# Patient Record
Sex: Male | Born: 1960 | Hispanic: No | Marital: Married | Smoking: Never smoker
Health system: Southern US, Community
[De-identification: ages and names within clinical notes are randomized; demographics above are authoritative.]

## PROBLEM LIST (undated history)

## (undated) DIAGNOSIS — E785 Hyperlipidemia, unspecified: Secondary | ICD-10-CM

## (undated) DIAGNOSIS — G4700 Insomnia, unspecified: Secondary | ICD-10-CM

## (undated) DIAGNOSIS — G4730 Sleep apnea, unspecified: Secondary | ICD-10-CM

## (undated) DIAGNOSIS — F32A Depression, unspecified: Secondary | ICD-10-CM

## (undated) DIAGNOSIS — F329 Major depressive disorder, single episode, unspecified: Secondary | ICD-10-CM

## (undated) DIAGNOSIS — M1990 Unspecified osteoarthritis, unspecified site: Secondary | ICD-10-CM

## (undated) HISTORY — DX: Hyperlipidemia, unspecified: E78.5

## (undated) HISTORY — DX: Insomnia, unspecified: G47.00

## (undated) HISTORY — PX: VASECTOMY: SHX75

## (undated) HISTORY — DX: Sleep apnea, unspecified: G47.30

## (undated) HISTORY — PX: APPENDECTOMY: SHX54

## (undated) HISTORY — DX: Major depressive disorder, single episode, unspecified: F32.9

## (undated) HISTORY — DX: Depression, unspecified: F32.A

## (undated) HISTORY — PX: OTHER SURGICAL HISTORY: SHX169

## (undated) HISTORY — PX: SPINE SURGERY: SHX786

---

## 2005-01-07 ENCOUNTER — Emergency Department: Payer: Self-pay | Admitting: Internal Medicine

## 2006-05-26 ENCOUNTER — Ambulatory Visit: Payer: Self-pay | Admitting: Gastroenterology

## 2008-01-12 ENCOUNTER — Other Ambulatory Visit: Payer: Self-pay

## 2008-01-12 ENCOUNTER — Emergency Department: Payer: Self-pay | Admitting: Internal Medicine

## 2015-04-24 ENCOUNTER — Encounter: Payer: Self-pay | Admitting: Family Medicine

## 2015-06-11 ENCOUNTER — Ambulatory Visit (INDEPENDENT_AMBULATORY_CARE_PROVIDER_SITE_OTHER): Payer: BC Managed Care – PPO | Admitting: Family Medicine

## 2015-06-11 ENCOUNTER — Encounter: Payer: Self-pay | Admitting: Family Medicine

## 2015-06-11 VITALS — BP 127/79 | HR 60 | Temp 97.7°F | Ht 69.6 in | Wt 227.0 lb

## 2015-06-11 DIAGNOSIS — E785 Hyperlipidemia, unspecified: Secondary | ICD-10-CM | POA: Insufficient documentation

## 2015-06-11 DIAGNOSIS — M25561 Pain in right knee: Secondary | ICD-10-CM | POA: Insufficient documentation

## 2015-06-11 DIAGNOSIS — Z0000 Encounter for general adult medical examination without abnormal findings: Secondary | ICD-10-CM

## 2015-06-11 DIAGNOSIS — G4700 Insomnia, unspecified: Secondary | ICD-10-CM | POA: Insufficient documentation

## 2015-06-11 LAB — URINALYSIS, ROUTINE W REFLEX MICROSCOPIC
Bilirubin, UA: NEGATIVE
GLUCOSE, UA: NEGATIVE
Ketones, UA: NEGATIVE
Leukocytes, UA: NEGATIVE
Nitrite, UA: NEGATIVE
PROTEIN UA: NEGATIVE
RBC, UA: NEGATIVE
Specific Gravity, UA: 1.015 (ref 1.005–1.030)
Urobilinogen, Ur: 0.2 mg/dL (ref 0.2–1.0)
pH, UA: 6 (ref 5.0–7.5)

## 2015-06-11 MED ORDER — SILDENAFIL CITRATE 100 MG PO TABS: 50.0000 mg | ORAL_TABLET | Freq: Every day | ORAL | Status: DC | PRN

## 2015-06-11 NOTE — Assessment & Plan Note (Signed)
Discuss knee pain with becoming a chronic condition will refer to orthopedics to further evaluate.

## 2015-06-11 NOTE — Progress Notes (Signed)
BP 127/79 mmHg  Pulse 60  Temp(Src) 97.7 F (36.5 C)  Ht 5' 9.6" (1.768 m)  Wt 227 lb (102.967 kg)  BMI 32.94 kg/m2  SpO2 99%   Subjective:    Patient ID: Kristopher Baird, male    DOB: 1961/03/12, 54 y.o.   MRN: [id number]  HPI: Kristopher Baird is a 54 y.o. male  Chief Complaint  Patient presents with  . Annual Exam   Patient for physical exam doing well was able to stop Ambien was a stormy month or so but is doing much better sleeping well Original need for sleep and medications as long gone with not working rotating shift.  Now problem is behind his right knee has some pain with first standing in the morning or after sitting for a while first step or so in his upper calf behind his right knee noted noticed swelling no knee trauma or other irritation. Did seem to start after had injection in his toe for what sounds like a neuroma.   Relevant past medical, surgical, family and social history reviewed and updated as indicated. Interim medical history since our last visit reviewed. Allergies and medications reviewed and updated.  Review of Systems  Constitutional: Negative.   HENT: Negative.   Eyes: Negative.   Respiratory: Negative.   Cardiovascular: Negative.   Gastrointestinal: Negative.   Endocrine: Negative.   Genitourinary: Negative.   Musculoskeletal: Negative.   Skin: Negative.   Allergic/Immunologic: Negative.   Neurological: Negative.   Hematological: Negative.   Psychiatric/Behavioral: Negative.     Per HPI unless specifically indicated above     Objective:    BP 127/79 mmHg  Pulse 60  Temp(Src) 97.7 F (36.5 C)  Ht 5' 9.6" (1.768 m)  Wt 227 lb (102.967 kg)  BMI 32.94 kg/m2  SpO2 99%  Wt Readings from Last 3 Encounters:  06/11/15 227 lb (102.967 kg)  01/20/15 226 lb (102.513 kg)    Physical Exam  Constitutional: He is oriented to person, place, and time. He appears well-developed and well-nourished.  HENT:  Head: Normocephalic and  atraumatic.  Right Ear: External ear normal.  Left Ear: External ear normal.  Eyes: Conjunctivae and EOM are normal. Pupils are equal, round, and reactive to light.  Neck: Normal range of motion. Neck supple.  Cardiovascular: Normal rate, regular rhythm, normal heart sounds and intact distal pulses.   Pulmonary/Chest: Effort normal and breath sounds normal.  Abdominal: Soft. Bowel sounds are normal. There is no splenomegaly or hepatomegaly.  Genitourinary: Rectum normal, prostate normal and penis normal.  Musculoskeletal: Normal range of motion.  Neurological: He is alert and oriented to person, place, and time. He has normal reflexes.  Skin: No rash noted. No erythema.  Psychiatric: He has a normal mood and affect. His behavior is normal. Judgment and thought content normal.    No results found for this or any previous visit.    Assessment & Plan:   Problem List Items Addressed This Visit      Other   Knee pain, right    Discuss knee pain with becoming a chronic condition will refer to orthopedics to further evaluate.      Relevant Orders   Ambulatory referral to Orthopedic Surgery    Other Visit Diagnoses    PE (physical exam), annual    -  Primary    Relevant Orders    CBC with Differential/Platelet    Lipid panel    Comprehensive metabolic panel    TSH  Urinalysis, Routine w reflex microscopic (not at [facility])    PSA        Follow up plan: Return if symptoms worsen or fail to improve.

## 2015-06-12 LAB — CBC WITH DIFFERENTIAL/PLATELET
BASOS ABS: 0 10*3/uL (ref 0.0–0.2)
Basos: 0 %
EOS (ABSOLUTE): 0.2 10*3/uL (ref 0.0–0.4)
EOS: 2 %
HEMOGLOBIN: 15 g/dL (ref 12.6–17.7)
Hematocrit: 43 % (ref 37.5–51.0)
Immature Grans (Abs): 0 10*3/uL (ref 0.0–0.1)
Immature Granulocytes: 0 %
LYMPHS ABS: 1.8 10*3/uL (ref 0.7–3.1)
Lymphs: 24 %
MCH: 28.6 pg (ref 26.6–33.0)
MCHC: 34.9 g/dL (ref 31.5–35.7)
MCV: 82 fL (ref 79–97)
MONOCYTES: 5 %
Monocytes Absolute: 0.4 10*3/uL (ref 0.1–0.9)
Neutrophils Absolute: 5.1 10*3/uL (ref 1.4–7.0)
Neutrophils: 69 %
Platelets: 177 10*3/uL (ref 150–379)
RBC: 5.25 x10E6/uL (ref 4.14–5.80)
RDW: 13.8 % (ref 12.3–15.4)
WBC: 7.6 10*3/uL (ref 3.4–10.8)

## 2015-06-12 LAB — COMPREHENSIVE METABOLIC PANEL
ALT: 16 IU/L (ref 0–44)
AST: 18 IU/L (ref 0–40)
Albumin/Globulin Ratio: 2.1 (ref 1.1–2.5)
Albumin: 4.7 g/dL (ref 3.5–5.5)
Alkaline Phosphatase: 90 IU/L (ref 39–117)
BILIRUBIN TOTAL: 0.5 mg/dL (ref 0.0–1.2)
BUN / CREAT RATIO: 10 (ref 9–20)
BUN: 13 mg/dL (ref 6–24)
CALCIUM: 9.6 mg/dL (ref 8.7–10.2)
CO2: 23 mmol/L (ref 18–29)
Chloride: 101 mmol/L (ref 97–108)
Creatinine, Ser: 1.27 mg/dL (ref 0.76–1.27)
GFR calc Af Amer: 74 mL/min/{1.73_m2} (ref >59)
GFR, EST NON AFRICAN AMERICAN: 64 mL/min/{1.73_m2} (ref >59)
GLOBULIN, TOTAL: 2.2 g/dL (ref 1.5–4.5)
Glucose: 103 mg/dL — ABNORMAL HIGH (ref 65–99)
Potassium: 4.4 mmol/L (ref 3.5–5.2)
SODIUM: 141 mmol/L (ref 134–144)
TOTAL PROTEIN: 6.9 g/dL (ref 6.0–8.5)

## 2015-06-12 LAB — LIPID PANEL
CHOLESTEROL TOTAL: 224 mg/dL — AB (ref 100–199)
Chol/HDL Ratio: 7.2 ratio units — ABNORMAL HIGH (ref 0.0–5.0)
HDL: 31 mg/dL — ABNORMAL LOW (ref >39)
LDL CALC: 150 mg/dL — AB (ref 0–99)
Triglycerides: 217 mg/dL — ABNORMAL HIGH (ref 0–149)
VLDL Cholesterol Cal: 43 mg/dL — ABNORMAL HIGH (ref 5–40)

## 2015-06-12 LAB — PSA: Prostate Specific Ag, Serum: 1.2 ng/mL (ref 0.0–4.0)

## 2015-06-12 LAB — TSH: TSH: 1.62 u[IU]/mL (ref 0.450–4.500)

## 2015-06-13 ENCOUNTER — Encounter: Payer: Self-pay | Admitting: Family Medicine

## 2015-11-26 ENCOUNTER — Ambulatory Visit: Payer: BC Managed Care – PPO | Admitting: Family Medicine

## 2015-12-03 ENCOUNTER — Encounter: Payer: Self-pay | Admitting: Family Medicine

## 2015-12-03 ENCOUNTER — Ambulatory Visit (INDEPENDENT_AMBULATORY_CARE_PROVIDER_SITE_OTHER): Payer: BC Managed Care – PPO | Admitting: Family Medicine

## 2015-12-03 VITALS — BP 133/82 | HR 67 | Temp 98.7°F | Ht 69.5 in | Wt 229.0 lb

## 2015-12-03 DIAGNOSIS — E785 Hyperlipidemia, unspecified: Secondary | ICD-10-CM | POA: Diagnosis not present

## 2015-12-03 DIAGNOSIS — G5761 Lesion of plantar nerve, right lower limb: Secondary | ICD-10-CM | POA: Diagnosis not present

## 2015-12-03 NOTE — Assessment & Plan Note (Signed)
Discussed care and treatment Morton's neuroma shoes ultimate treatment may be surgery can do 1 or 2 more injections

## 2015-12-03 NOTE — Progress Notes (Signed)
BP 133/82 mmHg  Pulse 67  Temp(Src) 98.7 F (37.1 C)  Ht 5' 9.5" (1.765 m)  Wt 229 lb (103.874 kg)  BMI 33.34 kg/m2  SpO2 99%   Subjective:    Patient ID: Kristopher Baird, male    DOB: 07-22-1961, 55 y.o.   MRN: [id number]  HPI: Kristopher Baird is a 55 y.o. male  Chief Complaint  Patient presents with  . Foot Pain    Patient with right foot pains been ongoing for about a year off and on podiary has done 2 shots shot between third webspace helped the most  And wants to have one again as good as started hurting again.  Reviewed Drs Irene Limbo note with injection done with relief of patient's symptoms patient with Morton's neuroma Reviewed patient not taking any medications that might aggravate pain no cholesterol medicines Relevant past medical, surgical, family and social history reviewed and updated as indicated. Interim medical history since our last visit reviewed. Allergies and medications reviewed and updated.  Review of Systems  Constitutional: Negative.   Respiratory: Negative.   Cardiovascular: Negative.     Per HPI unless specifically indicated above     Objective:    BP 133/82 mmHg  Pulse 67  Temp(Src) 98.7 F (37.1 C)  Ht 5' 9.5" (1.765 m)  Wt 229 lb (103.874 kg)  BMI 33.34 kg/m2  SpO2 99%  Wt Readings from Last 3 Encounters:  12/03/15 229 lb (103.874 kg)  06/11/15 227 lb (102.967 kg)  01/20/15 226 lb (102.513 kg)    Physical Exam  Constitutional: He is oriented to person, place, and time. He appears well-developed and well-nourished. No distress.  HENT:  Head: Normocephalic and atraumatic.  Right Ear: Hearing normal.  Left Ear: Hearing normal.  Nose: Nose normal.  Eyes: Conjunctivae and lids are normal. Right eye exhibits no discharge. Left eye exhibits no discharge. No scleral icterus.  Pulmonary/Chest: Effort normal. No respiratory distress.  Musculoskeletal: Normal range of motion.  Tenderness third web space right foot identified area prepped  with Betadine and alcohol and infiltrated with lidocaine with epinephrine and Kenalog with relief of patient's symptoms agent tolerated procedure well.  Neurological: He is alert and oriented to person, place, and time.  Skin: Skin is intact. No rash noted.  Psychiatric: He has a normal mood and affect. His speech is normal and behavior is normal. Judgment and thought content normal. Cognition and memory are normal.    Results for orders placed or performed in visit on 06/11/15  CBC with Differential/Platelet  Result Value Ref Range   WBC 7.6 3.4 - 10.8 x10E3/uL   RBC 5.25 4.14 - 5.80 x10E6/uL   Hemoglobin 15.0 12.6 - 17.7 g/dL   Hematocrit 40.9 81.1 - 51.0 %   MCV 82 79 - 97 fL   MCH 28.6 26.6 - 33.0 pg   MCHC 34.9 31.5 - 35.7 g/dL   RDW 91.4 78.2 - 95.6 %   Platelets 177 150 - 379 x10E3/uL   Neutrophils 69 %   Lymphs 24 %   Monocytes 5 %   Eos 2 %   Basos 0 %   Neutrophils Absolute 5.1 1.4 - 7.0 x10E3/uL   Lymphocytes Absolute 1.8 0.7 - 3.1 x10E3/uL   Monocytes Absolute 0.4 0.1 - 0.9 x10E3/uL   EOS (ABSOLUTE) 0.2 0.0 - 0.4 x10E3/uL   Basophils Absolute 0.0 0.0 - 0.2 x10E3/uL   Immature Granulocytes 0 %   Immature Grans (Abs) 0.0 0.0 - 0.1 x10E3/uL  Lipid panel  Result Value Ref Range   Cholesterol, Total 224 (H) 100 - 199 mg/dL   Triglycerides 086217 (H) 0 - 149 mg/dL   HDL 31 (L) >57>39 mg/dL   VLDL Cholesterol Cal 43 (H) 5 - 40 mg/dL   LDL Calculated 846150 (H) 0 - 99 mg/dL   Chol/HDL Ratio 7.2 (H) 0.0 - 5.0 ratio units  Comprehensive metabolic panel  Result Value Ref Range   Glucose 103 (H) 65 - 99 mg/dL   BUN 13 6 - 24 mg/dL   Creatinine, Ser 9.621.27 0.76 - 1.27 mg/dL   GFR calc non Af Amer 64 >59 mL/min/1.73   GFR calc Af Amer 74 >59 mL/min/1.73   BUN/Creatinine Ratio 10 9 - 20   Sodium 141 134 - 144 mmol/L   Potassium 4.4 3.5 - 5.2 mmol/L   Chloride 101 97 - 108 mmol/L   CO2 23 18 - 29 mmol/L   Calcium 9.6 8.7 - 10.2 mg/dL   Total Protein 6.9 6.0 - 8.5 g/dL   Albumin  4.7 3.5 - 5.5 g/dL   Globulin, Total 2.2 1.5 - 4.5 g/dL   Albumin/Globulin Ratio 2.1 1.1 - 2.5   Bilirubin Total 0.5 0.0 - 1.2 mg/dL   Alkaline Phosphatase 90 39 - 117 IU/L   AST 18 0 - 40 IU/L   ALT 16 0 - 44 IU/L  TSH  Result Value Ref Range   TSH 1.620 0.450 - 4.500 uIU/mL  Urinalysis, Routine w reflex microscopic ([facility])  Result Value Ref Range   Specific Gravity, UA 1.015 1.005 - 1.030   pH, UA 6.0 5.0 - 7.5   Color, UA Yellow Yellow   Appearance Ur Clear Clear   Leukocytes, UA Negative Negative   Protein, UA Negative Negative/Trace   Glucose, UA Negative Negative   Ketones, UA Negative Negative   RBC, UA Negative Negative   Bilirubin, UA Negative Negative   Urobilinogen, Ur 0.2 0.2 - 1.0 mg/dL   Nitrite, UA Negative Negative  PSA  Result Value Ref Range   Prostate Specific Ag, Serum 1.2 0.0 - 4.0 ng/mL      Assessment & Plan:   Problem List Items Addressed This Visit      Nervous and Auditory   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

## 2015-12-03 NOTE — Assessment & Plan Note (Signed)
Continue dietary control just diet nutrition weight loss

## 2016-05-25 ENCOUNTER — Encounter (INDEPENDENT_AMBULATORY_CARE_PROVIDER_SITE_OTHER): Payer: Self-pay

## 2016-06-14 ENCOUNTER — Encounter: Payer: BC Managed Care – PPO | Admitting: Family Medicine

## 2016-07-13 ENCOUNTER — Other Ambulatory Visit: Payer: Self-pay | Admitting: Family Medicine

## 2016-07-13 NOTE — Telephone Encounter (Signed)
Routing to provider  

## 2016-08-17 ENCOUNTER — Encounter: Payer: Self-pay | Admitting: Family Medicine

## 2016-08-17 ENCOUNTER — Ambulatory Visit (INDEPENDENT_AMBULATORY_CARE_PROVIDER_SITE_OTHER): Payer: BC Managed Care – PPO | Admitting: Family Medicine

## 2016-08-17 VITALS — BP 138/90 | HR 94 | Temp 98.2°F | Ht 70.0 in | Wt 233.4 lb

## 2016-08-17 DIAGNOSIS — S86912A Strain of unspecified muscle(s) and tendon(s) at lower leg level, left leg, initial encounter: Secondary | ICD-10-CM

## 2016-08-17 MED ORDER — MELOXICAM 15 MG PO TABS: 15.0000 mg | ORAL_TABLET | Freq: Every day | ORAL | 3 refills | Status: DC

## 2016-08-17 NOTE — Progress Notes (Signed)
BP 138/90 (BP Location: Left Arm, Patient Position: Sitting, Cuff Size: Large)   Pulse 94   Temp 98.2 F (36.8 C)   Ht 5\' 10"  (1.778 m)   Wt 233 lb 6.4 oz (105.9 kg)   SpO2 99%   BMI 33.49 kg/m    Subjective:    Patient ID: Kristopher Baird, male    DOB: 06/16/1961, 55 y.o.   MRN: [id number]  HPI: Kristopher Baird is a 55 y.o. male  Chief Complaint  Patient presents with  . Knee Pain    Left. Been bothering for a few days. Friday patient couldn't move it. Can't climb stairs. A little better today but still wanted to be seen.   Patient with no locking or clicking did give away due to the pain no specific trauma but started while at work and could barely climb the steps because the intense pain took Advil and Aleve with some relief. Now after 4 days is significantly better to the point where he could run on a treadmill. There is never any swelling.  Relevant past medical, surgical, family and social history reviewed and updated as indicated. Interim medical history since our last visit reviewed. Allergies and medications reviewed and updated.  Review of Systems  Constitutional: Negative.   Respiratory: Negative.   Cardiovascular: Negative.     Per HPI unless specifically indicated above     Objective:    BP 138/90 (BP Location: Left Arm, Patient Position: Sitting, Cuff Size: Large)   Pulse 94   Temp 98.2 F (36.8 C)   Ht 5\' 10"  (1.778 m)   Wt 233 lb 6.4 oz (105.9 kg)   SpO2 99%   BMI 33.49 kg/m   Wt Readings from Last 3 Encounters:  08/17/16 233 lb 6.4 oz (105.9 kg)  12/03/15 229 lb (103.9 kg)  06/11/15 227 lb (103 kg)    Physical Exam  Constitutional: He is oriented to person, place, and time. He appears well-developed and well-nourished. No distress.  HENT:  Head: Normocephalic and atraumatic.  Right Ear: Hearing normal.  Left Ear: Hearing normal.  Nose: Nose normal.  Eyes: Conjunctivae and lids are normal. Right eye exhibits no discharge. Left eye exhibits  no discharge. No scleral icterus.  Pulmonary/Chest: Effort normal. No respiratory distress.  Musculoskeletal: Normal range of motion.  Both knees normal size with full range of motion no redness or swelling. Left knee with no clicking locking giving way full range of motion with grind test patellar tests with no tenderness some medial joint tenderness but nothing specific.  Neurological: He is alert and oriented to person, place, and time.  Skin: Skin is intact. No rash noted.  Psychiatric: He has a normal mood and affect. His speech is normal and behavior is normal. Judgment and thought content normal. Cognition and memory are normal.    Results for orders placed or performed in visit on 06/11/15  CBC with Differential/Platelet  Result Value Ref Range   WBC 7.6 3.4 - 10.8 x10E3/uL   RBC 5.25 4.14 - 5.80 x10E6/uL   Hemoglobin 15.0 12.6 - 17.7 g/dL   Hematocrit 82.943.0 56.237.5 - 51.0 %   MCV 82 79 - 97 fL   MCH 28.6 26.6 - 33.0 pg   MCHC 34.9 31.5 - 35.7 g/dL   RDW 13.013.8 86.512.3 - 78.415.4 %   Platelets 177 150 - 379 x10E3/uL   Neutrophils 69 %   Lymphs 24 %   Monocytes 5 %   Eos 2 %  Basos 0 %   Neutrophils Absolute 5.1 1.4 - 7.0 x10E3/uL   Lymphocytes Absolute 1.8 0.7 - 3.1 x10E3/uL   Monocytes Absolute 0.4 0.1 - 0.9 x10E3/uL   EOS (ABSOLUTE) 0.2 0.0 - 0.4 x10E3/uL   Basophils Absolute 0.0 0.0 - 0.2 x10E3/uL   Immature Granulocytes 0 %   Immature Grans (Abs) 0.0 0.0 - 0.1 x10E3/uL  Lipid panel  Result Value Ref Range   Cholesterol, Total 224 (H) 100 - 199 mg/dL   Triglycerides 161217 (H) 0 - 149 mg/dL   HDL 31 (L) >09>39 mg/dL   VLDL Cholesterol Cal 43 (H) 5 - 40 mg/dL   LDL Calculated 604150 (H) 0 - 99 mg/dL   Chol/HDL Ratio 7.2 (H) 0.0 - 5.0 ratio units  Comprehensive metabolic panel  Result Value Ref Range   Glucose 103 (H) 65 - 99 mg/dL   BUN 13 6 - 24 mg/dL   Creatinine, Ser 5.401.27 0.76 - 1.27 mg/dL   GFR calc non Af Amer 64 >59 mL/min/1.73   GFR calc Af Amer 74 >59 mL/min/1.73    BUN/Creatinine Ratio 10 9 - 20   Sodium 141 134 - 144 mmol/L   Potassium 4.4 3.5 - 5.2 mmol/L   Chloride 101 97 - 108 mmol/L   CO2 23 18 - 29 mmol/L   Calcium 9.6 8.7 - 10.2 mg/dL   Total Protein 6.9 6.0 - 8.5 g/dL   Albumin 4.7 3.5 - 5.5 g/dL   Globulin, Total 2.2 1.5 - 4.5 g/dL   Albumin/Globulin Ratio 2.1 1.1 - 2.5   Bilirubin Total 0.5 0.0 - 1.2 mg/dL   Alkaline Phosphatase 90 39 - 117 IU/L   AST 18 0 - 40 IU/L   ALT 16 0 - 44 IU/L  TSH  Result Value Ref Range   TSH 1.620 0.450 - 4.500 uIU/mL  Urinalysis, Routine w reflex microscopic ([facility])  Result Value Ref Range   Specific Gravity, UA 1.015 1.005 - 1.030   pH, UA 6.0 5.0 - 7.5   Color, UA Yellow Yellow   Appearance Ur Clear Clear   Leukocytes, UA Negative Negative   Protein, UA Negative Negative/Trace   Glucose, UA Negative Negative   Ketones, UA Negative Negative   RBC, UA Negative Negative   Bilirubin, UA Negative Negative   Urobilinogen, Ur 0.2 0.2 - 1.0 mg/dL   Nitrite, UA Negative Negative  PSA  Result Value Ref Range   Prostate Specific Ag, Serum 1.2 0.0 - 4.0 ng/mL      Assessment & Plan:   Problem List Items Addressed This Visit    None    Visit Diagnoses    Knee strain, left, initial encounter    -  Primary   Discussed knee care and prevention strengthening. Discussed the uncertain prognosis and possibility if worsening with signs or symptoms of cartilage tear     Will need referral and imaging studies. Otherwise discuss use of prescription medicine such as meloxicam as safer alternative high-dose OTCs.  Follow up plan: Return for Physical Exam, As scheduled this spring.

## 2016-10-19 ENCOUNTER — Encounter: Payer: Self-pay | Admitting: Family Medicine

## 2016-10-19 ENCOUNTER — Ambulatory Visit (INDEPENDENT_AMBULATORY_CARE_PROVIDER_SITE_OTHER): Payer: BC Managed Care – PPO | Admitting: Family Medicine

## 2016-10-19 VITALS — BP 132/87 | HR 66 | Temp 98.4°F | Ht 70.47 in | Wt 231.0 lb

## 2016-10-19 DIAGNOSIS — Z1159 Encounter for screening for other viral diseases: Secondary | ICD-10-CM | POA: Diagnosis not present

## 2016-10-19 DIAGNOSIS — Z114 Encounter for screening for human immunodeficiency virus [HIV]: Secondary | ICD-10-CM | POA: Diagnosis not present

## 2016-10-19 DIAGNOSIS — Z125 Encounter for screening for malignant neoplasm of prostate: Secondary | ICD-10-CM

## 2016-10-19 DIAGNOSIS — E785 Hyperlipidemia, unspecified: Secondary | ICD-10-CM | POA: Diagnosis not present

## 2016-10-19 DIAGNOSIS — Z1329 Encounter for screening for other suspected endocrine disorder: Secondary | ICD-10-CM

## 2016-10-19 DIAGNOSIS — Z0000 Encounter for general adult medical examination without abnormal findings: Secondary | ICD-10-CM

## 2016-10-19 DIAGNOSIS — Z13220 Encounter for screening for lipoid disorders: Secondary | ICD-10-CM

## 2016-10-19 LAB — URINALYSIS, ROUTINE W REFLEX MICROSCOPIC
BILIRUBIN UA: NEGATIVE
Glucose, UA: NEGATIVE
KETONES UA: NEGATIVE
LEUKOCYTES UA: NEGATIVE
NITRITE UA: NEGATIVE
PH UA: 7 (ref 5.0–7.5)
Protein, UA: NEGATIVE
RBC UA: NEGATIVE
SPEC GRAV UA: 1.015 (ref 1.005–1.030)
UUROB: 0.2 mg/dL (ref 0.2–1.0)

## 2016-10-19 MED ORDER — SILDENAFIL CITRATE 100 MG PO TABS: 100.0000 mg | ORAL_TABLET | ORAL | 12 refills | Status: DC | PRN

## 2016-10-19 NOTE — Assessment & Plan Note (Signed)
Working on diet

## 2016-10-19 NOTE — Progress Notes (Signed)
BP 132/87   Pulse 66   Temp 98.4 F (36.9 C) (Oral)   Ht 5' 10.47" (1.79 m)   Wt 231 lb (104.8 kg)   SpO2 98%   BMI 32.70 kg/m    Subjective:    Patient ID: Kristopher Baird, male    DOB: [birth year], 56 y.o.   MRN: [id number]  HPI: Kristopher Baird is a 56 y.o. male  Annual exam  Patient working on weight loss is changed to diet and eating healthier is already noticed a difference in how he feels. Also trying to lower cholesterol and weight get below 200 pounds. Patient also with bilateral feet pain especially around his heels not with first step but more on into the day after his been on about half a day. Does not walk that much. Wear dress shoes. Relevant past medical, surgical, family and social history reviewed and updated as indicated. Interim medical history since our last visit reviewed. Allergies and medications reviewed and updated.  Review of Systems  Constitutional: Negative.   HENT: Negative.   Eyes: Negative.   Respiratory: Negative.   Cardiovascular: Negative.   Gastrointestinal: Negative.   Endocrine: Negative.   Genitourinary: Negative.   Musculoskeletal: Negative.   Skin: Negative.   Allergic/Immunologic: Negative.   Neurological: Negative.   Hematological: Negative.   Psychiatric/Behavioral: Negative.     Per HPI unless specifically indicated above     Objective:    BP 132/87   Pulse 66   Temp 98.4 F (36.9 C) (Oral)   Ht 5' 10.47" (1.79 m)   Wt 231 lb (104.8 kg)   SpO2 98%   BMI 32.70 kg/m   Wt Readings from Last 3 Encounters:  10/19/16 231 lb (104.8 kg)  08/17/16 233 lb 6.4 oz (105.9 kg)  12/03/15 229 lb (103.9 kg)    Physical Exam  Constitutional: He is oriented to person, place, and time. He appears well-developed and well-nourished.  HENT:  Head: Normocephalic and atraumatic.  Right Ear: External ear normal.  Left Ear: External ear normal.  Eyes: Conjunctivae and EOM are normal. Pupils are equal, round, and reactive to light.    Neck: Normal range of motion. Neck supple.  Cardiovascular: Normal rate, regular rhythm, normal heart sounds and intact distal pulses.   Pulmonary/Chest: Effort normal and breath sounds normal.  Abdominal: Soft. Bowel sounds are normal. There is no splenomegaly or hepatomegaly.  Genitourinary: Rectum normal, prostate normal and penis normal.  Musculoskeletal: Normal range of motion.  Neurological: He is alert and oriented to person, place, and time. He has normal reflexes.  Skin: No rash noted. No erythema.  Psychiatric: He has a normal mood and affect. His behavior is normal. Judgment and thought content normal.    Results for orders placed or performed in visit on 06/11/15  CBC with Differential/Platelet  Result Value Ref Range   WBC 7.6 3.4 - 10.8 x10E3/uL   RBC 5.25 4.14 - 5.80 x10E6/uL   Hemoglobin 15.0 12.6 - 17.7 g/dL   Hematocrit 95.6 21.3 - 51.0 %   MCV 82 79 - 97 fL   MCH 28.6 26.6 - 33.0 pg   MCHC 34.9 31.5 - 35.7 g/dL   RDW 08.6 57.8 - 46.9 %   Platelets 177 150 - 379 x10E3/uL   Neutrophils 69 %   Lymphs 24 %   Monocytes 5 %   Eos 2 %   Basos 0 %   Neutrophils Absolute 5.1 1.4 - 7.0 x10E3/uL   Lymphocytes Absolute 1.8 0.7 -  3.1 x10E3/uL   Monocytes Absolute 0.4 0.1 - 0.9 x10E3/uL   EOS (ABSOLUTE) 0.2 0.0 - 0.4 x10E3/uL   Basophils Absolute 0.0 0.0 - 0.2 x10E3/uL   Immature Granulocytes 0 %   Immature Grans (Abs) 0.0 0.0 - 0.1 x10E3/uL  Lipid panel  Result Value Ref Range   Cholesterol, Total 224 (H) 100 - 199 mg/dL   Triglycerides 657217 (H) 0 - 149 mg/dL   HDL 31 (L) >84>39 mg/dL   VLDL Cholesterol Cal 43 (H) 5 - 40 mg/dL   LDL Calculated 696150 (H) 0 - 99 mg/dL   Chol/HDL Ratio 7.2 (H) 0.0 - 5.0 ratio units  Comprehensive metabolic panel  Result Value Ref Range   Glucose 103 (H) 65 - 99 mg/dL   BUN 13 6 - 24 mg/dL   Creatinine, Ser 2.951.27 0.76 - 1.27 mg/dL   GFR calc non Af Amer 64 >59 mL/min/1.73   GFR calc Af Amer 74 >59 mL/min/1.73   BUN/Creatinine Ratio 10 9  - 20   Sodium 141 134 - 144 mmol/L   Potassium 4.4 3.5 - 5.2 mmol/L   Chloride 101 97 - 108 mmol/L   CO2 23 18 - 29 mmol/L   Calcium 9.6 8.7 - 10.2 mg/dL   Total Protein 6.9 6.0 - 8.5 g/dL   Albumin 4.7 3.5 - 5.5 g/dL   Globulin, Total 2.2 1.5 - 4.5 g/dL   Albumin/Globulin Ratio 2.1 1.1 - 2.5   Bilirubin Total 0.5 0.0 - 1.2 mg/dL   Alkaline Phosphatase 90 39 - 117 IU/L   AST 18 0 - 40 IU/L   ALT 16 0 - 44 IU/L  TSH  Result Value Ref Range   TSH 1.620 0.450 - 4.500 uIU/mL  Urinalysis, Routine w reflex microscopic ([facility])  Result Value Ref Range   Specific Gravity, UA 1.015 1.005 - 1.030   pH, UA 6.0 5.0 - 7.5   Color, UA Yellow Yellow   Appearance Ur Clear Clear   Leukocytes, UA Negative Negative   Protein, UA Negative Negative/Trace   Glucose, UA Negative Negative   Ketones, UA Negative Negative   RBC, UA Negative Negative   Bilirubin, UA Negative Negative   Urobilinogen, Ur 0.2 0.2 - 1.0 mg/dL   Nitrite, UA Negative Negative  PSA  Result Value Ref Range   Prostate Specific Ag, Serum 1.2 0.0 - 4.0 ng/mL      Assessment & Plan:   Problem List Items Addressed This Visit      Other   Hyperlipidemia    Working on diet       Relevant Medications   sildenafil (VIAGRA) 100 MG tablet   Other Relevant Orders   CBC with Differential/Platelet   Comprehensive metabolic panel   Lipid panel   Urinalysis, Routine w reflex microscopic    Other Visit Diagnoses    Annual physical exam    -  Primary   Relevant Orders   Hepatitis C antibody   HIV antibody   CBC with Differential/Platelet   Comprehensive metabolic panel   Lipid panel   PSA   TSH   Urinalysis, Routine w reflex microscopic   Need for hepatitis C screening test       Relevant Orders   Hepatitis C antibody   Encounter for screening for HIV       Relevant Orders   HIV antibody   Screening cholesterol level       Relevant Orders   Lipid panel   Prostate cancer  screening       Relevant Orders    PSA   Thyroid disorder screen       Relevant Orders   TSH     lt arch of foot pain use inserts advil etc Cont wt loss may help foot also  If Continued problems will need podiatry referral. We'll monitor cholesterol diet to see about medications. Follow up plan: Return in about 1 year (around 10/19/2017) for Physical Exam.

## 2016-10-20 LAB — LIPID PANEL
CHOLESTEROL TOTAL: 175 mg/dL (ref 100–199)
Chol/HDL Ratio: 5.8 ratio units — ABNORMAL HIGH (ref 0.0–5.0)
HDL: 30 mg/dL — ABNORMAL LOW (ref >39)
LDL Calculated: 118 mg/dL — ABNORMAL HIGH (ref 0–99)
Triglycerides: 135 mg/dL (ref 0–149)
VLDL CHOLESTEROL CAL: 27 mg/dL (ref 5–40)

## 2016-10-20 LAB — HIV ANTIBODY (ROUTINE TESTING W REFLEX): HIV SCREEN 4TH GENERATION: NONREACTIVE

## 2016-10-20 LAB — COMPREHENSIVE METABOLIC PANEL
ALBUMIN: 4.4 g/dL (ref 3.5–5.5)
ALK PHOS: 79 IU/L (ref 39–117)
ALT: 23 IU/L (ref 0–44)
AST: 22 IU/L (ref 0–40)
Albumin/Globulin Ratio: 2.2 (ref 1.2–2.2)
BILIRUBIN TOTAL: 0.5 mg/dL (ref 0.0–1.2)
BUN / CREAT RATIO: 14 (ref 9–20)
BUN: 20 mg/dL (ref 6–24)
CHLORIDE: 101 mmol/L (ref 96–106)
CO2: 22 mmol/L (ref 18–29)
Calcium: 9.3 mg/dL (ref 8.7–10.2)
Creatinine, Ser: 1.41 mg/dL — ABNORMAL HIGH (ref 0.76–1.27)
GFR calc Af Amer: 65 mL/min/{1.73_m2} (ref >59)
GFR calc non Af Amer: 56 mL/min/{1.73_m2} — ABNORMAL LOW (ref >59)
GLUCOSE: 110 mg/dL — AB (ref 65–99)
Globulin, Total: 2 g/dL (ref 1.5–4.5)
Potassium: 4.4 mmol/L (ref 3.5–5.2)
Sodium: 141 mmol/L (ref 134–144)
Total Protein: 6.4 g/dL (ref 6.0–8.5)

## 2016-10-20 LAB — CBC WITH DIFFERENTIAL/PLATELET
BASOS ABS: 0 10*3/uL (ref 0.0–0.2)
Basos: 0 %
EOS (ABSOLUTE): 0.2 10*3/uL (ref 0.0–0.4)
Eos: 4 %
HEMOGLOBIN: 14.2 g/dL (ref 13.0–17.7)
Hematocrit: 42.4 % (ref 37.5–51.0)
Immature Grans (Abs): 0 10*3/uL (ref 0.0–0.1)
Immature Granulocytes: 0 %
Lymphocytes Absolute: 1.5 10*3/uL (ref 0.7–3.1)
Lymphs: 26 %
MCH: 27.5 pg (ref 26.6–33.0)
MCHC: 33.5 g/dL (ref 31.5–35.7)
MCV: 82 fL (ref 79–97)
MONOS ABS: 0.5 10*3/uL (ref 0.1–0.9)
Monocytes: 8 %
NEUTROS ABS: 3.7 10*3/uL (ref 1.4–7.0)
Neutrophils: 62 %
PLATELETS: 152 10*3/uL (ref 150–379)
RBC: 5.17 x10E6/uL (ref 4.14–5.80)
RDW: 14 % (ref 12.3–15.4)
WBC: 6 10*3/uL (ref 3.4–10.8)

## 2016-10-20 LAB — PSA: PROSTATE SPECIFIC AG, SERUM: 1.1 ng/mL (ref 0.0–4.0)

## 2016-10-20 LAB — TSH: TSH: 1.94 u[IU]/mL (ref 0.450–4.500)

## 2016-10-20 LAB — HEPATITIS C ANTIBODY

## 2016-10-21 ENCOUNTER — Telehealth: Payer: Self-pay | Admitting: Family Medicine

## 2016-10-21 NOTE — Telephone Encounter (Signed)
Phone call Discussed with patient slight decline in renal function. Patient not using Advil and Aleve discuss Will observe by checking BMP next office visit.

## 2016-12-22 ENCOUNTER — Ambulatory Visit (INDEPENDENT_AMBULATORY_CARE_PROVIDER_SITE_OTHER): Payer: BC Managed Care – PPO | Admitting: Family Medicine

## 2016-12-22 ENCOUNTER — Encounter: Payer: Self-pay | Admitting: Family Medicine

## 2016-12-22 VITALS — BP 135/92 | HR 88 | Wt 250.0 lb

## 2016-12-22 DIAGNOSIS — M722 Plantar fascial fibromatosis: Secondary | ICD-10-CM | POA: Diagnosis not present

## 2016-12-22 DIAGNOSIS — N183 Chronic kidney disease, stage 3 unspecified: Secondary | ICD-10-CM

## 2016-12-22 MED ORDER — TRAMADOL HCL 50 MG PO TABS: 50.0000 mg | ORAL_TABLET | Freq: Three times a day (TID) | ORAL | 1 refills | Status: DC | PRN

## 2016-12-22 NOTE — Assessment & Plan Note (Signed)
Discussed care and treatment never going barefooted continuing local treatments like he is doing Will add tramadol [place] pending blood work. Patient's renal function had fallen off last visit we will check BMP again. We will keep appointment with podiatry.

## 2016-12-22 NOTE — Progress Notes (Signed)
BP (!) 135/92   Pulse 88   Wt 250 lb (113.4 kg)   SpO2 98%   BMI 35.39 kg/m    Subjective:    Patient ID: Kristopher Baird, male    DOB: June 11, 1961, 56 y.o.   MRN: [id number]  HPI: Kristopher Baird is a 56 y.o. male  Chief Complaint  Patient presents with  . Foot Pain  Patient with plantar fasciitis but pain is been 2 foot doctor previously stride inserts had injections but still pain is getting much more unbearable. This weekend can hardly walk when getting out of bed with bilateral heel pain. Patient doing ice rolls inserts shoes don't sound like he is wearing shoes whenever he standing. 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  Relevant past medical, surgical, family and social history reviewed and updated as indicated. Interim medical history since our last visit reviewed. Allergies and medications reviewed and updated.  Review of Systems  Constitutional: Negative.   Respiratory: Negative.   Cardiovascular: Negative.     Per HPI unless specifically indicated above     Objective:    BP (!) 135/92   Pulse 88   Wt 250 lb (113.4 kg)   SpO2 98%   BMI 35.39 kg/m   Wt Readings from Last 3 Encounters:  12/22/16 250 lb (113.4 kg)  10/19/16 231 lb (104.8 kg)  08/17/16 233 lb 6.4 oz (105.9 kg)    Physical Exam  Constitutional: He is oriented to person, place, and time. He appears well-developed and well-nourished.  HENT:  Head: Normocephalic and atraumatic.  Eyes: Conjunctivae and EOM are normal.  Neck: Normal range of motion.  Cardiovascular: Normal rate, regular rhythm and normal heart sounds.   Pulmonary/Chest: Effort normal and breath sounds normal.  Musculoskeletal: Normal range of motion.  Feet with tender with plantar fasciitis changes  left great toe with tenderness no gout changes  Neurological: He is alert and oriented to person, place, and time.    Skin: No erythema.  Psychiatric: He has a normal mood and affect. His behavior is normal. Judgment and thought content normal.    Results for orders placed or performed in visit on 10/19/16  Hepatitis C antibody  Result Value Ref Range   Hep C Virus Ab <0.1 0.0 - 0.9 s/co ratio  HIV antibody  Result Value Ref Range   HIV Screen 4th Generation wRfx Non Reactive Non Reactive  CBC with Differential/Platelet  Result Value Ref Range   WBC 6.0 3.4 - 10.8 x10E3/uL   RBC 5.17 4.14 - 5.80 x10E6/uL   Hemoglobin 14.2 13.0 - 17.7 g/dL   Hematocrit 40.9 81.1 - 51.0 %   MCV 82 79 - 97 fL   MCH 27.5 26.6 - 33.0 pg   MCHC 33.5 31.5 - 35.7 g/dL   RDW 91.4 78.2 - 95.6 %   Platelets 152 150 - 379 x10E3/uL   Neutrophils 62 Not Estab. %   Lymphs 26 Not Estab. %   Monocytes 8 Not Estab. %   Eos 4 Not Estab. %   Basos 0 Not Estab. %   Neutrophils Absolute 3.7 1.4 - 7.0 x10E3/uL   Lymphocytes Absolute 1.5 0.7 - 3.1 x10E3/uL   Monocytes Absolute 0.5 0.1 - 0.9 x10E3/uL   EOS (ABSOLUTE) 0.2 0.0 - 0.4 x10E3/uL   Basophils Absolute 0.0 0.0 - 0.2 x10E3/uL   Immature Granulocytes 0 Not Estab. %  Immature Grans (Abs) 0.0 0.0 - 0.1 x10E3/uL  Comprehensive metabolic panel  Result Value Ref Range   Glucose 110 (H) 65 - 99 mg/dL   BUN 20 6 - 24 mg/dL   Creatinine, Ser 9.60 (H) 0.76 - 1.27 mg/dL   GFR calc non Af Amer 56 (L) >59 mL/min/1.73   GFR calc Af Amer 65 >59 mL/min/1.73   BUN/Creatinine Ratio 14 9 - 20   Sodium 141 134 - 144 mmol/L   Potassium 4.4 3.5 - 5.2 mmol/L   Chloride 101 96 - 106 mmol/L   CO2 22 18 - 29 mmol/L   Calcium 9.3 8.7 - 10.2 mg/dL   Total Protein 6.4 6.0 - 8.5 g/dL   Albumin 4.4 3.5 - 5.5 g/dL   Globulin, Total 2.0 1.5 - 4.5 g/dL   Albumin/Globulin Ratio 2.2 1.2 - 2.2   Bilirubin Total 0.5 0.0 - 1.2 mg/dL   Alkaline Phosphatase 79 39 - 117 IU/L   AST 22 0 - 40 IU/L   ALT 23 0 - 44 IU/L  Lipid panel  Result Value Ref Range   Cholesterol, Total 175 100 - 199 mg/dL    Triglycerides 454 0 - 149 mg/dL   HDL 30 (L) >09 mg/dL   VLDL Cholesterol Cal 27 5 - 40 mg/dL   LDL Calculated 811 (H) 0 - 99 mg/dL   Chol/HDL Ratio 5.8 (H) 0.0 - 5.0 ratio units  PSA  Result Value Ref Range   Prostate Specific Ag, Serum 1.1 0.0 - 4.0 ng/mL  TSH  Result Value Ref Range   TSH 1.940 0.450 - 4.500 uIU/mL  Urinalysis, Routine w reflex microscopic  Result Value Ref Range   Specific Gravity, UA 1.015 1.005 - 1.030   pH, UA 7.0 5.0 - 7.5   Color, UA Yellow Yellow   Appearance Ur Clear Clear   Leukocytes, UA Negative Negative   Protein, UA Negative Negative/Trace   Glucose, UA Negative Negative   Ketones, UA Negative Negative   RBC, UA Negative Negative   Bilirubin, UA Negative Negative   Urobilinogen, Ur 0.2 0.2 - 1.0 mg/dL   Nitrite, UA Negative Negative      Assessment & Plan:   Problem List Items Addressed This Visit      Musculoskeletal and Integument   Plantar fasciitis, bilateral    Discussed care and treatment never going barefooted continuing local treatments like he is doing Will add tramadol [place] pending blood work. Patient's renal function had fallen off last visit we will check BMP again. We will keep appointment with podiatry.       Other Visit Diagnoses    CKD (chronic kidney disease) stage 3, GFR 30-59 ml/min    -  Primary   Relevant Orders   Basic metabolic panel       Follow up plan: Return for As scheduled.

## 2016-12-23 LAB — BASIC METABOLIC PANEL
BUN / CREAT RATIO: 13 (ref 9–20)
BUN: 15 mg/dL (ref 6–24)
CHLORIDE: 102 mmol/L (ref 96–106)
CO2: 27 mmol/L (ref 18–29)
CREATININE: 1.17 mg/dL (ref 0.76–1.27)
Calcium: 9.3 mg/dL (ref 8.7–10.2)
GFR calc Af Amer: 81 mL/min/{1.73_m2} (ref >59)
GFR, EST NON AFRICAN AMERICAN: 70 mL/min/{1.73_m2} (ref >59)
GLUCOSE: 113 mg/dL — AB (ref 65–99)
Potassium: 4.4 mmol/L (ref 3.5–5.2)
Sodium: 141 mmol/L (ref 134–144)

## 2016-12-27 ENCOUNTER — Encounter: Payer: Self-pay | Admitting: Family Medicine

## 2017-04-27 ENCOUNTER — Ambulatory Visit: Payer: BC Managed Care – PPO | Admitting: Family Medicine

## 2017-05-03 ENCOUNTER — Encounter: Payer: Self-pay | Admitting: *Deleted

## 2017-05-05 ENCOUNTER — Other Ambulatory Visit: Payer: Self-pay | Admitting: Podiatry

## 2017-05-05 NOTE — Discharge Instructions (Signed)
[facility] °[facility] ° °POST OPERATIVE INSTRUCTIONS FOR DR. TROXLER AND DR. FOWLER °[facility] ° ° °1. Take your medication as prescribed.  Pain medication should be taken only as needed. ° °2. Keep the dressing clean, dry and intact. ° °3. Keep your foot elevated above the heart level for the first 48 hours. ° °4. Walking to the bathroom and brief periods of walking are acceptable, unless we have instructed you to be non-weight bearing. ° °5. Always wear your post-op shoe when walking.  Always use your crutches if you are to be non-weight bearing. ° °6. Do not take a shower. Baths are permissible as long as the foot is kept out of the water.  ° °7. Every hour you are awake:  °- Bend your knee 15 times. °- Flex foot 15 times °- Massage calf 15 times ° °8. Call [facility] ([phone]) if any of the following problems occur: °- You develop a temperature or fever. °- The bandage becomes saturated with blood. °- Medication does not stop your pain. °- Injury of the foot occurs. °- Any symptoms of infection including redness, odor, or red streaks running from wound. ° ° °General Anesthesia, Adult, Care After °These instructions provide you with information about caring for yourself after your procedure. Your health care provider may also give you more specific instructions. Your treatment has been planned according to current medical practices, but problems sometimes occur. Call your health care provider if you have any problems or questions after your procedure. °What can I expect after the procedure? °After the procedure, it is common to have: °· Vomiting. °· A sore throat. °· Mental slowness. ° °It is common to feel: °· Nauseous. °· Cold or shivery. °· Sleepy. °· Tired. °· Sore or achy, even in parts of your body where you did not have surgery. ° °Follow these instructions at home: °For at least 24 hours after the procedure: °· Do not: °? Participate in  activities where you could fall or become injured. °? Drive. °? Use heavy machinery. °? Drink alcohol. °? Take sleeping pills or medicines that cause drowsiness. °? Make important decisions or sign legal documents. °? Take care of children on your own. °· Rest. °Eating and drinking °· If you vomit, drink water, juice, or soup when you can drink without vomiting. °· Drink enough fluid to keep your urine clear or pale yellow. °· Make sure you have little or no nausea before eating solid foods. °· Follow the diet recommended by your health care provider. °General instructions °· Have a responsible adult stay with you until you are awake and alert. °· Return to your normal activities as told by your health care provider. Ask your health care provider what activities are safe for you. °· Take over-the-counter and prescription medicines only as told by your health care provider. °· If you smoke, do not smoke without supervision. °· Keep all follow-up visits as told by your health care provider. This is important. °Contact a health care provider if: °· You continue to have nausea or vomiting at home, and medicines are not helpful. °· You cannot drink fluids or start eating again. °· You cannot urinate after 8-12 hours. °· You develop a skin rash. °· You have fever. °· You have increasing redness at the site of your procedure. °Get help right away if: °· You have difficulty breathing. °· You have chest pain. °· You have unexpected bleeding. °· You feel that you   are having a life-threatening or urgent problem. °This information is not intended to replace advice given to you by your health care provider. Make sure you discuss any questions you have with your health care provider. °Document Released: 11/29/2000 Document Revised: 01/26/2016 Document Reviewed: 08/07/2015 °Elsevier Interactive Patient Education © 2018 [employer] ° °

## 2017-05-11 ENCOUNTER — Encounter
Admission: RE | Disposition: A | Discharge status: Home / Self Care | Payer: Self-pay | Source: Ambulatory Visit | Attending: Podiatry

## 2017-05-11 ENCOUNTER — Ambulatory Visit: Payer: BC Managed Care – PPO | Admitting: Anesthesiology

## 2017-05-11 ENCOUNTER — Ambulatory Visit
Admission: RE | Admit: 2017-05-11 | Discharge: 2017-05-11 | Disposition: A | Discharge status: Home / Self Care | Payer: BC Managed Care – PPO | Source: Ambulatory Visit | Attending: Podiatry | Admitting: Podiatry

## 2017-05-11 DIAGNOSIS — G4730 Sleep apnea, unspecified: Secondary | ICD-10-CM | POA: Diagnosis not present

## 2017-05-11 DIAGNOSIS — E785 Hyperlipidemia, unspecified: Secondary | ICD-10-CM | POA: Diagnosis not present

## 2017-05-11 DIAGNOSIS — Z79899 Other long term (current) drug therapy: Secondary | ICD-10-CM | POA: Diagnosis not present

## 2017-05-11 DIAGNOSIS — M205X1 Other deformities of toe(s) (acquired), right foot: Secondary | ICD-10-CM | POA: Insufficient documentation

## 2017-05-11 DIAGNOSIS — Z888 Allergy status to other drugs, medicaments and biological substances status: Secondary | ICD-10-CM | POA: Insufficient documentation

## 2017-05-11 DIAGNOSIS — M205X9 Other deformities of toe(s) (acquired), unspecified foot: Secondary | ICD-10-CM | POA: Diagnosis present

## 2017-05-11 DIAGNOSIS — Z880 Allergy status to penicillin: Secondary | ICD-10-CM | POA: Insufficient documentation

## 2017-05-11 DIAGNOSIS — F329 Major depressive disorder, single episode, unspecified: Secondary | ICD-10-CM | POA: Insufficient documentation

## 2017-05-11 HISTORY — PX: HALLUX VALGUS CHEILECTOMY: SHX6625

## 2017-05-11 SURGERY — CHEILECTOMY, GREAT TOE, WITH IMPLANT INSERTION
Anesthesia: General | Site: Foot | Laterality: Right | Wound class: Clean

## 2017-05-11 MED ORDER — PROPOFOL 10 MG/ML IV BOLUS
INTRAVENOUS | Status: DC | PRN
  Administered 2017-05-11: 200 mg via INTRAVENOUS

## 2017-05-11 MED ORDER — OXYCODONE-ACETAMINOPHEN 5-325 MG PO TABS: 1.0000 | ORAL_TABLET | ORAL | 0 refills | Status: DC | PRN

## 2017-05-11 MED ORDER — BUPIVACAINE LIPOSOME 1.3 % IJ SUSP
INTRAMUSCULAR | Status: DC | PRN
  Administered 2017-05-11: 10 mL

## 2017-05-11 MED ORDER — ONDANSETRON HCL 4 MG/2ML IJ SOLN
INTRAMUSCULAR | Status: DC | PRN
  Administered 2017-05-11: 4 mg via INTRAVENOUS

## 2017-05-11 MED ORDER — POVIDONE-IODINE 7.5 % EX SOLN: Freq: Once | CUTANEOUS | Status: DC

## 2017-05-11 MED ORDER — DEXAMETHASONE SODIUM PHOSPHATE 4 MG/ML IJ SOLN
INTRAMUSCULAR | Status: DC | PRN
  Administered 2017-05-11: 4 mg via INTRAVENOUS

## 2017-05-11 MED ORDER — ONDANSETRON HCL 4 MG/2ML IJ SOLN: 4.0000 mg | Freq: Once | INTRAMUSCULAR | Status: DC | PRN

## 2017-05-11 MED ORDER — BUPIVACAINE HCL (PF) 0.25 % IJ SOLN
INTRAMUSCULAR | Status: DC | PRN
  Administered 2017-05-11: 10 mL

## 2017-05-11 MED ORDER — FENTANYL CITRATE (PF) 100 MCG/2ML IJ SOLN
INTRAMUSCULAR | Status: DC | PRN
  Administered 2017-05-11: 50 ug via INTRAVENOUS

## 2017-05-11 MED ORDER — OXYCODONE HCL 5 MG/5ML PO SOLN: 5.0000 mg | Freq: Once | ORAL | Status: AC | PRN

## 2017-05-11 MED ORDER — FENTANYL CITRATE (PF) 100 MCG/2ML IJ SOLN: 25.0000 ug | INTRAMUSCULAR | Status: DC | PRN

## 2017-05-11 MED ORDER — LIDOCAINE HCL (CARDIAC) 20 MG/ML IV SOLN
INTRAVENOUS | Status: DC | PRN
  Administered 2017-05-11: 40 mg via INTRATRACHEAL

## 2017-05-11 MED ORDER — OXYCODONE HCL 5 MG PO TABS
5.0000 mg | ORAL_TABLET | Freq: Once | ORAL | Status: AC | PRN
  Administered 2017-05-11: 5 mg via ORAL

## 2017-05-11 MED ORDER — MIDAZOLAM HCL 5 MG/5ML IJ SOLN
INTRAMUSCULAR | Status: DC | PRN
  Administered 2017-05-11: 2 mg via INTRAVENOUS

## 2017-05-11 MED ORDER — LACTATED RINGERS IV SOLN: INTRAVENOUS | Status: DC

## 2017-05-11 MED ORDER — LACTATED RINGERS IV SOLN
INTRAVENOUS | Status: DC
Start: 2017-05-11 — End: 2017-05-11
  Administered 2017-05-11: 12:00:00 via INTRAVENOUS

## 2017-05-11 MED ORDER — GLYCOPYRROLATE 0.2 MG/ML IJ SOLN
INTRAMUSCULAR | Status: DC | PRN
  Administered 2017-05-11: 0.1 mg via INTRAVENOUS

## 2017-05-11 MED ORDER — CLINDAMYCIN PHOSPHATE 900 MG/50ML IV SOLN
900.0000 mg | INTRAVENOUS | Status: AC
  Administered 2017-05-11: 900 mg via INTRAVENOUS

## 2017-05-11 MED ORDER — ACETAMINOPHEN 10 MG/ML IV SOLN
1000.0000 mg | Freq: Once | INTRAVENOUS | Status: DC | PRN
  Administered 2017-05-11: 1000 mg via INTRAVENOUS

## 2017-05-11 SURGICAL SUPPLY — 45 items
BANDAGE ELASTIC 4 VELCRO NS (GAUZE/BANDAGES/DRESSINGS) ×2 IMPLANT
BENZOIN TINCTURE PRP APPL 2/3 (GAUZE/BANDAGES/DRESSINGS) ×2 IMPLANT
BLADE MED AGGRESSIVE (BLADE) ×2 IMPLANT
BLADE OSC/SAGITTAL MD 5.5X18 (BLADE) IMPLANT
BLADE SURG 15 STRL LF DISP TIS (BLADE) ×1 IMPLANT
BLADE SURG 15 STRL SS (BLADE) ×1
BNDG COHESIVE 4X5 TAN STRL (GAUZE/BANDAGES/DRESSINGS) ×2 IMPLANT
BNDG ESMARK 4X12 TAN STRL LF (GAUZE/BANDAGES/DRESSINGS) ×2 IMPLANT
BNDG GAUZE 4.5X4.1 6PLY STRL (MISCELLANEOUS) ×2 IMPLANT
BNDG STRETCH 4X75 STRL LF (GAUZE/BANDAGES/DRESSINGS) ×2 IMPLANT
CANISTER SUCT 1200ML W/VALVE (MISCELLANEOUS) ×2 IMPLANT
COVER LIGHT HANDLE UNIVERSAL (MISCELLANEOUS) ×4 IMPLANT
CUFF TOURN SGL QUICK 18 (TOURNIQUET CUFF) ×2 IMPLANT
DRAPE FLUOR MINI C-ARM 54X84 (DRAPES) ×2 IMPLANT
DURAPREP 26ML APPLICATOR (WOUND CARE) ×2 IMPLANT
GAUZE PETRO XEROFOAM 1X8 (MISCELLANEOUS) ×2 IMPLANT
GAUZE SPONGE 4X4 12PLY STRL (GAUZE/BANDAGES/DRESSINGS) ×2 IMPLANT
GLOVE BIO SURGEON STRL SZ7.5 (GLOVE) ×4 IMPLANT
GLOVE INDICATOR 8.0 STRL GRN (GLOVE) ×4 IMPLANT
GOWN STRL REUS W/ TWL LRG LVL3 (GOWN DISPOSABLE) ×2 IMPLANT
GOWN STRL REUS W/TWL LRG LVL3 (GOWN DISPOSABLE) ×2
K-WIRE DBL END TROCAR 6X.045 (WIRE)
K-WIRE DBL END TROCAR 6X.062 (WIRE)
KIT ROOM TURNOVER OR (KITS) ×2 IMPLANT
KWIRE DBL END TROCAR 6X.045 (WIRE) IMPLANT
KWIRE DBL END TROCAR 6X.062 (WIRE) IMPLANT
NS IRRIG 500ML POUR BTL (IV SOLUTION) ×2 IMPLANT
PACK EXTREMITY ARMC (MISCELLANEOUS) ×2 IMPLANT
PAD GROUND ADULT SPLIT (MISCELLANEOUS) ×2 IMPLANT
PIN BALLS 3/8 F/.045 WIRE (MISCELLANEOUS) IMPLANT
RASP SM TEAR CROSS CUT (RASP) ×2 IMPLANT
STOCKINETTE IMPERVIOUS LG (DRAPES) ×2 IMPLANT
STRAP BODY AND KNEE 60X3 (MISCELLANEOUS) ×2 IMPLANT
STRIP CLOSURE SKIN 1/4X4 (GAUZE/BANDAGES/DRESSINGS) ×2 IMPLANT
SUT ETHILON 4-0 (SUTURE)
SUT ETHILON 4-0 FS2 18XMFL BLK (SUTURE)
SUT ETHILON 5-0 FS-2 18 BLK (SUTURE) IMPLANT
SUT MNCRL 5-0+ PC-1 (SUTURE) ×1 IMPLANT
SUT MONOCRYL 5-0 (SUTURE) ×1
SUT VIC AB 2-0 SH 27 (SUTURE)
SUT VIC AB 2-0 SH 27XBRD (SUTURE) IMPLANT
SUT VIC AB 3-0 SH 27 (SUTURE)
SUT VIC AB 3-0 SH 27X BRD (SUTURE) IMPLANT
SUT VIC AB 4-0 FS2 27 (SUTURE) ×2 IMPLANT
SUTURE ETHLN 4-0 FS2 18XMF BLK (SUTURE) IMPLANT

## 2017-05-11 NOTE — Anesthesia Postprocedure Evaluation (Signed)
Anesthesia Post Note  Patient: Kristopher Baird  Procedure(s) Performed: Procedure(s) (LRB): HALLUX VALGUS CHEILECTOMY (Right)  Patient location during evaluation: PACU Anesthesia Type: General Level of consciousness: awake Pain management: pain level controlled Vital Signs Assessment: post-procedure vital signs reviewed and stable Respiratory status: spontaneous breathing, nonlabored ventilation and respiratory function stable Cardiovascular status: stable Postop Assessment: no signs of nausea or vomiting Anesthetic complications: no    Jola BabinskiElsje Jaeson Baird

## 2017-05-11 NOTE — Anesthesia Preprocedure Evaluation (Addendum)
Anesthesia Evaluation  Patient identified by MRN, date of birth, ID band Patient awake    Reviewed: Allergy & Precautions, H&P , NPO status , Patient's Chart, lab work & pertinent test results  Airway Mallampati: III  TM Distance: >3 FB     Dental  (+) Teeth Intact   Pulmonary sleep apnea (suggestive history, no CPAP) , neg recent URI,    breath sounds clear to auscultation       Cardiovascular (-) hypertension(-) angina Rhythm:Regular Rate:Normal  hyperlipidemia   Neuro/Psych PSYCHIATRIC DISORDERS (history of depression - no meds currently)    GI/Hepatic negative GI ROS, Neg liver ROS, neg GERD  ,  Endo/Other  BMI 31  Renal/GU      Musculoskeletal   Abdominal   Peds  Hematology negative hematology ROS (+)   Anesthesia Other Findings   Reproductive/Obstetrics                            Anesthesia Physical Anesthesia Plan  ASA: II  Anesthesia Plan: General   Post-op Pain Management:    Induction:   PONV Risk Score and Plan:   Airway Management Planned: LMA  Additional Equipment:   Intra-op Plan:   Post-operative Plan:   Informed Consent: I have reviewed the patients History and Physical, chart, labs and discussed the procedure including the risks, benefits and alternatives for the proposed anesthesia with the patient or authorized representative who has indicated his/her understanding and acceptance.     Plan Discussed with: CRNA  Anesthesia Plan Comments:         Anesthesia Quick Evaluation

## 2017-05-11 NOTE — H&P (Signed)
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  The various methods of treatment have been discussed with the patient.  No guarantees were given.  After consideration of risks, benefits and other options for treatment, the patient has consented to surgery.  I have reviewed the patients' chart and labs.    No data found.   Baird history and physical examination was performed in my office.  The patient was reexamined.  There have been no changes to this history and physical examination.  Kristopher Baird, Kristopher Baird

## 2017-05-11 NOTE — Anesthesia Procedure Notes (Signed)
Procedure Name: LMA Insertion Date/Time: 05/11/2017 12:45 PM Performed by: Kristopher PicketAMYOT, Kristopher Baird Pre-anesthesia Checklist: Patient identified, Emergency Drugs available, Suction available, Timeout performed and Patient being monitored Patient Re-evaluated:Patient Re-evaluated prior to induction Oxygen Delivery Method: Circle system utilized Preoxygenation: Pre-oxygenation with 100% oxygen Induction Type: IV induction LMA: LMA inserted LMA Size: 4.0 Number of attempts: 1 Placement Confirmation: positive ETCO2 and breath sounds checked- equal and bilateral Tube secured with: Tape

## 2017-05-11 NOTE — Transfer of Care (Signed)
Immediate Anesthesia Transfer of Care Note  Patient: Kristopher Baird  Procedure(s) Performed: Procedure(s): HALLUX VALGUS CHEILECTOMY (Right)  Patient Location: PACU  Anesthesia Type: General  Level of Consciousness: awake, alert  and patient cooperative  Airway and Oxygen Therapy: Patient Spontanous Breathing and Patient connected to supplemental oxygen  Post-op Assessment: Post-op Vital signs reviewed, Patient's Cardiovascular Status Stable, Respiratory Function Stable, Patent Airway and No signs of Nausea or vomiting  Post-op Vital Signs: Reviewed and stable  Complications: No apparent anesthesia complications

## 2017-05-11 NOTE — Op Note (Signed)
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 that presents today for surgical intervention.  The risks/benefits/alternatives/complications have been discussed and consent has been given.    Procedure:  Patient was brought into the OR and placed on the operating table in thesupine position. After anesthesia was obtained theright lower extremity was prepped and draped in usual sterile fashion. The first ray was anesthetized with a 50-50 mixture of 0.25% Marcaine and ask Exparel long-acting Marcaine. A total of 20 cc of the mixture was used.  Attention was directed to the dorsal right first MTPJ where a dorsal medial incision was performed. Sharp and blunt dissection carried down to the capsule. A longitudinal capsulotomy was then performed. This exposed the head of the metatarsal and base of the proximal phalanx. Periarticular spurring was noted around the first MTPJ both on the metatarsal and proximal phalanx. These were then removed with a rongeur.  At this point further contouring was then performed of the metatarsophalangeal joint with a power rasp. The head of the metatarsal had a noted circular defect on the dorsal lateral border of the metatarsal head. There was noted be a very scant amount of cartilage overlying this region. Similar to the metatarsal head on the base of the proximal phalanx and the dorsal lateral one quarter was thinning of the cartilage. All areas were smoothed. The very dorsal one quarter of the metatarsal head did have noted loss of cartilage and this was then removed with a power saw and smoothed with a power rasp. The  dorsal 25% of the base of the proximal phalanx was also removed with a power saw and power rasp. All areas were evaluated at this time. Good smooth range of motion was noted of approximately 45 dorsiflexion. No catching or locking was noted. All areas were visualized with fluoroscopy preoperatively and postoperatively for evaluation and good removal of spurring was noted. The wound was flushed with copious amounts or irrigation. Layered closure was then performed with a 4-0 Vicryl for the capsular layer and subcutaneous tissue and a 5-0 Monocryl undyed for skin.    Patient tolerated the procedure and anesthesia well.  Was transported from the OR to the PACU with all vital signs stable and vascular status intact. To be discharged per routine protocol.  Will follow up in approximately 1 week in the outpatient clinic.

## 2017-05-12 ENCOUNTER — Encounter: Payer: Self-pay | Admitting: Podiatry

## 2017-08-31 ENCOUNTER — Other Ambulatory Visit: Payer: Self-pay | Admitting: Family Medicine

## 2017-09-01 NOTE — Telephone Encounter (Signed)
Chart up to date. Medication refill.

## 2017-09-01 NOTE — Telephone Encounter (Signed)
Routing to provider for refill

## 2017-10-25 ENCOUNTER — Ambulatory Visit (INDEPENDENT_AMBULATORY_CARE_PROVIDER_SITE_OTHER): Payer: BC Managed Care – PPO | Admitting: Family Medicine

## 2017-10-25 ENCOUNTER — Encounter: Payer: Self-pay | Admitting: Family Medicine

## 2017-10-25 ENCOUNTER — Ambulatory Visit (INDEPENDENT_AMBULATORY_CARE_PROVIDER_SITE_OTHER): Payer: Self-pay

## 2017-10-25 ENCOUNTER — Ambulatory Visit: Admission: RE | Admit: 2017-10-25 | Payer: BC Managed Care – PPO | Source: Ambulatory Visit | Admitting: *Deleted

## 2017-10-25 VITALS — BP 121/81 | HR 63 | Ht 70.0 in | Wt 206.0 lb

## 2017-10-25 DIAGNOSIS — G8929 Other chronic pain: Secondary | ICD-10-CM | POA: Diagnosis not present

## 2017-10-25 DIAGNOSIS — M25552 Pain in left hip: Secondary | ICD-10-CM | POA: Diagnosis not present

## 2017-10-25 DIAGNOSIS — Z0001 Encounter for general adult medical examination with abnormal findings: Secondary | ICD-10-CM

## 2017-10-25 DIAGNOSIS — Z0000 Encounter for general adult medical examination without abnormal findings: Secondary | ICD-10-CM

## 2017-10-25 LAB — URINALYSIS, ROUTINE W REFLEX MICROSCOPIC
Bilirubin, UA: NEGATIVE
Glucose, UA: NEGATIVE
Ketones, UA: NEGATIVE
Leukocytes, UA: NEGATIVE
Nitrite, UA: NEGATIVE
PH UA: 6 (ref 5.0–7.5)
Protein, UA: NEGATIVE
RBC, UA: NEGATIVE
Specific Gravity, UA: 1.015 (ref 1.005–1.030)
UUROB: 0.2 mg/dL (ref 0.2–1.0)

## 2017-10-25 MED ORDER — MELOXICAM 15 MG PO TABS: 15.0000 mg | ORAL_TABLET | Freq: Every day | ORAL | 3 refills | Status: DC

## 2017-10-25 NOTE — Addendum Note (Signed)
Addended by: Sheilah MinsFOX, Lukisha Procida A on: 10/25/2017 09:42 AM   Modules accepted: Orders

## 2017-10-25 NOTE — Progress Notes (Signed)
BP 121/81   Pulse 63   Ht 5\' 10"  (1.778 m)   Wt 206 lb (93.4 kg)   SpO2 99%   BMI 29.56 kg/m    Subjective:    Patient ID: Kristopher Baird, male    DOB: 08/20/1961, 57 y.o.   MRN: [id number]  HPI: Kristopher Baird is a 57 y.o. male  Chief Complaint  Patient presents with  . Annual Exam  . Leg Pain    at night, Left hip area.   great wt loss Patient with left hip pain discomfort wakes him from sleep to 3 AM.  Seems to come to radiate around in his hip area and into his knee has had some radiating back and forth to his right hip area and right knee area also.  This is been ongoing for several months no known specific trauma. Patient did have trauma in August with a fall and large hematoma of his right hip area.  X-rays reported as negative but not available. No rash in this area no blood in stool or urine. Patient's been losing weight and trying to control cholesterol.  Has been doing well. Not taking any medicine for hip or back. Has recovered well from foot surgery.  Relevant past medical, surgical, family and social history reviewed and updated as indicated. Interim medical history since our last visit reviewed. Allergies and medications reviewed and updated.  Review of Systems  Constitutional: Negative.   HENT: Negative.   Eyes: Negative.   Respiratory: Negative.   Cardiovascular: Negative.   Gastrointestinal: Negative.   Endocrine: Negative.   Genitourinary: Negative.   Musculoskeletal: Negative.   Skin: Negative.   Allergic/Immunologic: Negative.   Neurological: Negative.   Hematological: Negative.   Psychiatric/Behavioral: Negative.     Per HPI unless specifically indicated above     Objective:    BP 121/81   Pulse 63   Ht 5\' 10"  (1.778 m)   Wt 206 lb (93.4 kg)   SpO2 99%   BMI 29.56 kg/m   Wt Readings from Last 3 Encounters:  10/25/17 206 lb (93.4 kg)  05/11/17 216 lb (98 kg)  12/22/16 250 lb (113.4 kg)    Physical Exam  Constitutional: He is  oriented to person, place, and time. He appears well-developed and well-nourished.  HENT:  Head: Normocephalic and atraumatic.  Right Ear: External ear normal.  Left Ear: External ear normal.  Eyes: Conjunctivae and EOM are normal. Pupils are equal, round, and reactive to light.  Neck: Normal range of motion. Neck supple.  Cardiovascular: Normal rate, regular rhythm, normal heart sounds and intact distal pulses.  Pulmonary/Chest: Effort normal and breath sounds normal.  Abdominal: Soft. Bowel sounds are normal. There is no splenomegaly or hepatomegaly.  Genitourinary: Rectum normal, prostate normal and penis normal.  Musculoskeletal: Normal range of motion.  Neurological: He is alert and oriented to person, place, and time. He has normal reflexes.  Skin: No rash noted. No erythema.  Psychiatric: He has a normal mood and affect. His behavior is normal. Judgment and thought content normal.    Results for orders placed or performed in visit on 12/22/16  Basic metabolic panel  Result Value Ref Range   Glucose 113 (H) 65 - 99 mg/dL   BUN 15 6 - 24 mg/dL   Creatinine, Ser 6.571.17 0.76 - 1.27 mg/dL   GFR calc non Af Amer 70 >59 mL/min/1.73   GFR calc Af Amer 81 >59 mL/min/1.73   BUN/Creatinine Ratio 13 9 - 20  Sodium 141 134 - 144 mmol/L   Potassium 4.4 3.5 - 5.2 mmol/L   Chloride 102 96 - 106 mmol/L   CO2 27 18 - 29 mmol/L   Calcium 9.3 8.7 - 10.2 mg/dL      Assessment & Plan:   Problem List Items Addressed This Visit      Other   Hip pain, chronic, left    For chronic hip pain that bothersome at night only will try meloxicam.  Observe response get x-ray and follow-up as indicated.      Relevant Medications   meloxicam (MOBIC) 15 MG tablet   Other Relevant Orders   XR HIP UNILAT W OR W/O PELVIS 2-3 VIEWS LEFT    Other Visit Diagnoses    PE (physical exam), annual    -  Primary   Relevant Orders   CBC with Differential/Platelet   Comprehensive metabolic panel   Lipid panel     PSA   TSH   Urinalysis, Routine w reflex microscopic   Ambulatory referral to Gastroenterology       Follow up plan: Return in about 1 year (around 10/25/2018) for Physical Exam.

## 2017-10-25 NOTE — Assessment & Plan Note (Signed)
For chronic hip pain that bothersome at night only will try meloxicam.  Observe response get x-ray and follow-up as indicated.

## 2017-10-26 ENCOUNTER — Telehealth: Payer: Self-pay | Admitting: Family Medicine

## 2017-10-26 DIAGNOSIS — D649 Anemia, unspecified: Secondary | ICD-10-CM

## 2017-10-26 LAB — LIPID PANEL
CHOL/HDL RATIO: 5.9 ratio — AB (ref 0.0–5.0)
Cholesterol, Total: 148 mg/dL (ref 100–199)
HDL: 25 mg/dL — AB (ref >39)
LDL Calculated: 100 mg/dL — ABNORMAL HIGH (ref 0–99)
Triglycerides: 115 mg/dL (ref 0–149)
VLDL CHOLESTEROL CAL: 23 mg/dL (ref 5–40)

## 2017-10-26 LAB — CBC WITH DIFFERENTIAL/PLATELET
BASOS ABS: 0 10*3/uL (ref 0.0–0.2)
Basos: 0 %
EOS (ABSOLUTE): 0.1 10*3/uL (ref 0.0–0.4)
Eos: 2 %
Hematocrit: 37.5 % (ref 37.5–51.0)
Hemoglobin: 12.9 g/dL — ABNORMAL LOW (ref 13.0–17.7)
IMMATURE GRANULOCYTES: 0 %
Immature Grans (Abs): 0 10*3/uL (ref 0.0–0.1)
Lymphocytes Absolute: 1.4 10*3/uL (ref 0.7–3.1)
Lymphs: 24 %
MCH: 28 pg (ref 26.6–33.0)
MCHC: 34.4 g/dL (ref 31.5–35.7)
MCV: 81 fL (ref 79–97)
MONOS ABS: 0.5 10*3/uL (ref 0.1–0.9)
Monocytes: 9 %
NEUTROS PCT: 65 %
Neutrophils Absolute: 3.8 10*3/uL (ref 1.4–7.0)
PLATELETS: 178 10*3/uL (ref 150–379)
RBC: 4.61 x10E6/uL (ref 4.14–5.80)
RDW: 14.1 % (ref 12.3–15.4)
WBC: 5.9 10*3/uL (ref 3.4–10.8)

## 2017-10-26 LAB — COMPREHENSIVE METABOLIC PANEL
A/G RATIO: 1.6 (ref 1.2–2.2)
ALT: 15 IU/L (ref 0–44)
AST: 11 IU/L (ref 0–40)
Albumin: 4.1 g/dL (ref 3.5–5.5)
Alkaline Phosphatase: 86 IU/L (ref 39–117)
BUN/Creatinine Ratio: 12 (ref 9–20)
BUN: 15 mg/dL (ref 6–24)
Bilirubin Total: 0.4 mg/dL (ref 0.0–1.2)
CALCIUM: 9.2 mg/dL (ref 8.7–10.2)
CO2: 24 mmol/L (ref 20–29)
Chloride: 105 mmol/L (ref 96–106)
Creatinine, Ser: 1.27 mg/dL (ref 0.76–1.27)
GFR calc Af Amer: 73 mL/min/{1.73_m2} (ref >59)
GFR, EST NON AFRICAN AMERICAN: 63 mL/min/{1.73_m2} (ref >59)
GLUCOSE: 94 mg/dL (ref 65–99)
Globulin, Total: 2.6 g/dL (ref 1.5–4.5)
POTASSIUM: 4.7 mmol/L (ref 3.5–5.2)
Sodium: 142 mmol/L (ref 134–144)
TOTAL PROTEIN: 6.7 g/dL (ref 6.0–8.5)

## 2017-10-26 LAB — TSH: TSH: 2.1 u[IU]/mL (ref 0.450–4.500)

## 2017-10-26 LAB — PSA: Prostate Specific Ag, Serum: 1.7 ng/mL (ref 0.0–4.0)

## 2017-10-26 NOTE — Telephone Encounter (Signed)
Phone call Discussed with patient hemoglobin slight drop and on review has dropped 2 points over the last 2 years.  Will recheck CBC and iron studies in 1 month or so.

## 2017-10-30 ENCOUNTER — Encounter: Payer: Self-pay | Admitting: Family Medicine

## 2017-11-04 ENCOUNTER — Encounter: Payer: Self-pay | Admitting: *Deleted

## 2017-11-25 ENCOUNTER — Other Ambulatory Visit: Payer: BC Managed Care – PPO

## 2017-11-25 DIAGNOSIS — D649 Anemia, unspecified: Secondary | ICD-10-CM

## 2017-11-26 LAB — CBC WITH DIFFERENTIAL/PLATELET
BASOS: 0 %
Basophils Absolute: 0 10*3/uL (ref 0.0–0.2)
EOS (ABSOLUTE): 0.1 10*3/uL (ref 0.0–0.4)
EOS: 2 %
HEMATOCRIT: 40.1 % (ref 37.5–51.0)
HEMOGLOBIN: 13.5 g/dL (ref 13.0–17.7)
IMMATURE GRANULOCYTES: 0 %
Immature Grans (Abs): 0 10*3/uL (ref 0.0–0.1)
Lymphocytes Absolute: 1.7 10*3/uL (ref 0.7–3.1)
Lymphs: 25 %
MCH: 27.7 pg (ref 26.6–33.0)
MCHC: 33.7 g/dL (ref 31.5–35.7)
MCV: 82 fL (ref 79–97)
MONOCYTES: 5 %
MONOS ABS: 0.4 10*3/uL (ref 0.1–0.9)
NEUTROS PCT: 68 %
Neutrophils Absolute: 4.6 10*3/uL (ref 1.4–7.0)
Platelets: 180 10*3/uL (ref 150–379)
RBC: 4.88 x10E6/uL (ref 4.14–5.80)
RDW: 14.1 % (ref 12.3–15.4)
WBC: 6.7 10*3/uL (ref 3.4–10.8)

## 2017-11-26 LAB — IRON AND TIBC
IRON: 52 ug/dL (ref 38–169)
Iron Saturation: 27 % (ref 15–55)
Total Iron Binding Capacity: 196 ug/dL — ABNORMAL LOW (ref 250–450)
UIBC: 144 ug/dL (ref 111–343)

## 2017-11-26 LAB — FERRITIN: Ferritin: 270 ng/mL (ref 30–400)

## 2017-11-26 LAB — RETICULOCYTES: Retic Ct Pct: 0.9 % (ref 0.6–2.6)

## 2017-11-28 ENCOUNTER — Telehealth: Payer: Self-pay | Admitting: Gastroenterology

## 2017-11-28 NOTE — Telephone Encounter (Signed)
Gastroenterology Pre-Procedure Review  Request Date:  Requesting Physician: Dr.   PATIENT REVIEW QUESTIONS: The patient responded to the following health history questions as indicated:    1. Are you having any GI issues? no 2. Do you have a personal history of Polyps? no 3. Do you have a family history of Colon Cancer or Polyps? no 4. Diabetes Mellitus? no 5. Joint replacements in the past 12 months?no 6. Major health problems in the past 3 months?no 7. Any artificial heart valves, MVP, or defibrillator?no    MEDICATIONS & ALLERGIES:    Patient reports the following regarding taking any anticoagulation/antiplatelet therapy:   Plavix, Coumadin, Eliquis, Xarelto, Lovenox, Pradaxa, Brilinta, or Effient? no Aspirin? no  Patient confirms/reports the following medications:  Current Outpatient Medications  Medication Sig Dispense Refill   meloxicam (MOBIC) 15 MG tablet Take 1 tablet (15 mg total) by mouth daily. 30 tablet 3   sildenafil (VIAGRA) 100 MG tablet Take 1 tablet (100 mg total) by mouth as needed for erectile dysfunction. (Patient not taking: Reported on 05/03/2017) 9 tablet 12   No current facility-administered medications for this visit.     Patient confirms/reports the following allergies:  Allergies  Allergen Reactions   Penicillins     Doesn't remember   Prevacid [Lansoprazole] Swelling    No orders of the defined types were placed in this encounter.   AUTHORIZATION INFORMATION Primary Insurance: 1D#: Group #:  Secondary Insurance: 1D#: Group #:  SCHEDULE INFORMATION: Date:  Time: Location:

## 2017-12-02 ENCOUNTER — Other Ambulatory Visit: Payer: Self-pay

## 2017-12-02 ENCOUNTER — Telehealth: Payer: Self-pay

## 2017-12-02 DIAGNOSIS — Z1211 Encounter for screening for malignant neoplasm of colon: Secondary | ICD-10-CM

## 2017-12-02 NOTE — Telephone Encounter (Signed)
LVM for pt to inform him of his scheduled colonoscopy for April 15th Monday, informed him his instructions will be mailed to him along with rx for bowel prep and to contact office if this date does not work for him.  Thanks, Marcelino DusterMichelle

## 2017-12-13 ENCOUNTER — Encounter: Payer: Self-pay | Admitting: *Deleted

## 2017-12-13 ENCOUNTER — Other Ambulatory Visit: Payer: Self-pay

## 2017-12-15 NOTE — Discharge Instructions (Signed)
General Anesthesia, Adult, Care After °These instructions provide you with information about caring for yourself after your procedure. Your health care provider may also give you more specific instructions. Your treatment has been planned according to current medical practices, but problems sometimes occur. Call your health care provider if you have any problems or questions after your procedure. °What can I expect after the procedure? °After the procedure, it is common to have: °· Vomiting. °· A sore throat. °· Mental slowness. ° °It is common to feel: °· Nauseous. °· Cold or shivery. °· Sleepy. °· Tired. °· Sore or achy, even in parts of your body where you did not have surgery. ° °Follow these instructions at home: °For at least 24 hours after the procedure: °· Do not: °? Participate in activities where you could fall or become injured. °? Drive. °? Use heavy machinery. °? Drink alcohol. °? Take sleeping pills or medicines that cause drowsiness. °? Make important decisions or sign legal documents. °? Take care of children on your own. °· Rest. °Eating and drinking °· If you vomit, drink water, juice, or soup when you can drink without vomiting. °· Drink enough fluid to keep your urine clear or pale yellow. °· Make sure you have little or no nausea before eating solid foods. °· Follow the diet recommended by your health care provider. °General instructions °· Have a responsible adult stay with you until you are awake and alert. °· Return to your normal activities as told by your health care provider. Ask your health care provider what activities are safe for you. °· Take over-the-counter and prescription medicines only as told by your health care provider. °· If you smoke, do not smoke without supervision. °· Keep all follow-up visits as told by your health care provider. This is important. °Contact a health care provider if: °· You continue to have nausea or vomiting at home, and medicines are not helpful. °· You  cannot drink fluids or start eating again. °· You cannot urinate after 8-12 hours. °· You develop a skin rash. °· You have fever. °· You have increasing redness at the site of your procedure. °Get help right away if: °· You have difficulty breathing. °· You have chest pain. °· You have unexpected bleeding. °· You feel that you are having a life-threatening or urgent problem. °This information is not intended to replace advice given to you by your health care provider. Make sure you discuss any questions you have with your health care provider. °Document Released: 11/29/2000 Document Revised: 01/26/2016 Document Reviewed: 08/07/2015 °Elsevier Interactive Patient Education © 2018 [employer] ° °

## 2017-12-19 ENCOUNTER — Ambulatory Visit
Admission: RE | Admit: 2017-12-19 | Discharge: 2017-12-19 | Disposition: A | Discharge status: Home / Self Care | Payer: BC Managed Care – PPO | Source: Ambulatory Visit | Attending: Gastroenterology | Admitting: Gastroenterology

## 2017-12-19 ENCOUNTER — Ambulatory Visit: Payer: BC Managed Care – PPO | Admitting: Anesthesiology

## 2017-12-19 ENCOUNTER — Encounter
Admission: RE | Disposition: A | Discharge status: Home / Self Care | Payer: Self-pay | Source: Ambulatory Visit | Attending: Gastroenterology

## 2017-12-19 DIAGNOSIS — G4730 Sleep apnea, unspecified: Secondary | ICD-10-CM | POA: Diagnosis not present

## 2017-12-19 DIAGNOSIS — Z1211 Encounter for screening for malignant neoplasm of colon: Secondary | ICD-10-CM | POA: Diagnosis not present

## 2017-12-19 DIAGNOSIS — K641 Second degree hemorrhoids: Secondary | ICD-10-CM | POA: Insufficient documentation

## 2017-12-19 HISTORY — PX: COLONOSCOPY WITH PROPOFOL: SHX5780

## 2017-12-19 SURGERY — COLONOSCOPY WITH PROPOFOL
Anesthesia: General | Site: Rectum | Wound class: Contaminated

## 2017-12-19 MED ORDER — ONDANSETRON HCL 4 MG/2ML IJ SOLN: 4.0000 mg | Freq: Once | INTRAMUSCULAR | Status: DC | PRN

## 2017-12-19 MED ORDER — LIDOCAINE HCL (CARDIAC) 20 MG/ML IV SOLN
INTRAVENOUS | Status: DC | PRN
  Administered 2017-12-19: 30 mg via INTRAVENOUS

## 2017-12-19 MED ORDER — PROPOFOL 10 MG/ML IV BOLUS
INTRAVENOUS | Status: DC | PRN
  Administered 2017-12-19: 150 mg via INTRAVENOUS

## 2017-12-19 MED ORDER — SODIUM CHLORIDE 0.9 % IV SOLN: INTRAVENOUS | Status: DC

## 2017-12-19 MED ORDER — LACTATED RINGERS IV SOLN
INTRAVENOUS | Status: DC
  Administered 2017-12-19: 09:00:00 via INTRAVENOUS

## 2017-12-19 MED ORDER — ACETAMINOPHEN 160 MG/5ML PO SOLN: 325.0000 mg | ORAL | Status: DC | PRN

## 2017-12-19 MED ORDER — ACETAMINOPHEN 325 MG PO TABS: 650.0000 mg | ORAL_TABLET | Freq: Once | ORAL | Status: DC | PRN

## 2017-12-19 MED ORDER — STERILE WATER FOR IRRIGATION IR SOLN
Status: DC | PRN
  Administered 2017-12-19: .5 mL

## 2017-12-19 SURGICAL SUPPLY — 5 items
CANISTER SUCT 1200ML W/VALVE (MISCELLANEOUS) ×3 IMPLANT
GOWN CVR UNV OPN BCK APRN NK (MISCELLANEOUS) ×2 IMPLANT
GOWN ISOL THUMB LOOP REG UNIV (MISCELLANEOUS) ×4
KIT ENDO PROCEDURE OLY (KITS) ×3 IMPLANT
WATER STERILE IRR 250ML POUR (IV SOLUTION) ×3 IMPLANT

## 2017-12-19 NOTE — Anesthesia Procedure Notes (Signed)
Date/Time: 12/19/2017 9:03 AM Performed by: Maree KrabbeWarren, Coryn Mosso, CRNA Pre-anesthesia Checklist: Patient identified, Emergency Drugs available, Suction available, Timeout performed and Patient being monitored Patient Re-evaluated:Patient Re-evaluated prior to induction Oxygen Delivery Method: Nasal cannula Placement Confirmation: positive ETCO2

## 2017-12-19 NOTE — Anesthesia Postprocedure Evaluation (Signed)
Anesthesia Post Note  Patient: Milus BanisterStephen Beranek  Procedure(s) Performed: COLONOSCOPY WITH PROPOFOL (N/A Rectum)  Patient location during evaluation: PACU Anesthesia Type: General Level of consciousness: awake and alert, oriented and patient cooperative Pain management: pain level controlled Vital Signs Assessment: post-procedure vital signs reviewed and stable Respiratory status: spontaneous breathing, nonlabored ventilation and respiratory function stable Cardiovascular status: blood pressure returned to baseline and stable Postop Assessment: adequate PO intake Anesthetic complications: no    Reed BreechAndrea Arius Harnois

## 2017-12-19 NOTE — Anesthesia Preprocedure Evaluation (Signed)
Anesthesia Evaluation  Patient identified by MRN, date of birth, ID band Patient awake    Reviewed: Allergy & Precautions, NPO status , Patient's Chart, lab work & pertinent test results  History of Anesthesia Complications Negative for: history of anesthetic complications  Airway Mallampati: III  TM Distance: >3 FB Neck ROM: Full    Dental no notable dental hx.    Pulmonary sleep apnea ,    Pulmonary exam normal breath sounds clear to auscultation       Cardiovascular Exercise Tolerance: Good negative cardio ROS Normal cardiovascular exam Rhythm:Regular Rate:Normal     Neuro/Psych PSYCHIATRIC DISORDERS Depression negative neurological ROS     GI/Hepatic negative GI ROS,   Endo/Other  negative endocrine ROS  Renal/GU negative Renal ROS     Musculoskeletal   Abdominal   Peds  Hematology negative hematology ROS (+)   Anesthesia Other Findings   Reproductive/Obstetrics                             Anesthesia Physical Anesthesia Plan  ASA: II  Anesthesia Plan: General   Post-op Pain Management:    Induction: Intravenous  PONV Risk Score and Plan: 1 and Propofol infusion and TIVA  Airway Management Planned: Natural Airway  Additional Equipment:   Intra-op Plan:   Post-operative Plan:   Informed Consent: I have reviewed the patients History and Physical, chart, labs and discussed the procedure including the risks, benefits and alternatives for the proposed anesthesia with the patient or authorized representative who has indicated his/her understanding and acceptance.     Plan Discussed with: CRNA  Anesthesia Plan Comments:         Anesthesia Quick Evaluation

## 2017-12-19 NOTE — Op Note (Signed)
[facility] Gastroenterology Patient Name: Kristopher Baird Procedure Date: 12/19/2017 8:58 AM MRN: [id number] Account #: [id number] Date of Birth: Sep 20, 1960 Admit Type: Outpatient Age: 57 Room: [facility] OR ROOM 01 Gender: Male Note Status: Finalized Procedure:            Colonoscopy Indications:          Screening for colorectal malignant neoplasm Providers:            Midge Minium MD, MD Referring MD:         Steele Sizer, MD (Referring MD) Medicines:            Propofol per Anesthesia Complications:        No immediate complications. Procedure:            Pre-Anesthesia Assessment:                       - Prior to the procedure, a History and Physical was                        performed, and patient medications and allergies were                        reviewed. The patient's tolerance of previous                        anesthesia was also reviewed. The risks and benefits of                        the procedure and the sedation options and risks were                        discussed with the patient. All questions were                        answered, and informed consent was obtained. Prior                        Anticoagulants: The patient has taken no previous                        anticoagulant or antiplatelet agents. ASA Grade                        Assessment: II - A patient with mild systemic disease.                        After reviewing the risks and benefits, the patient was                        deemed in satisfactory condition to undergo the                        procedure.                       After obtaining informed consent, the colonoscope was                        passed under direct vision. Throughout the procedure,  the patient's blood pressure, pulse, and oxygen                        saturations were monitored continuously. The Olympus                        CF-HQ190L Colonoscope (S#. [id number]) was introduced                      through the anus and advanced to the the cecum,                        identified by appendiceal orifice and ileocecal valve.                        The colonoscopy was performed without difficulty. The                        patient tolerated the procedure well. The quality of                        the bowel preparation was excellent. Findings:      The perianal and digital rectal examinations were normal.      Non-bleeding internal hemorrhoids were found during retroflexion. The       hemorrhoids were Grade II (internal hemorrhoids that prolapse but reduce       spontaneously).      The exam was otherwise without abnormality. Impression:           - Non-bleeding internal hemorrhoids.                       - The examination was otherwise normal.                       - No specimens collected. Recommendation:       - Discharge patient to home.                       - Resume previous diet.                       - Continue present medications.                       - Repeat colonoscopy in 10 years for screening unless                        any change in family history or lower GI problems. Procedure Code(s):    --- Professional ---                       [phone], Colonoscopy, flexible; diagnostic, including                        collection of specimen(s) by brushing or washing, when                        performed (separate procedure) Diagnosis Code(s):    --- Professional ---                       Z12.11, Encounter for screening for malignant neoplasm  of colon CPT copyright 2017 American Medical Association. All rights reserved. The codes documented in this report are preliminary and upon coder review may  be revised to meet current compliance requirements. Midge Minium MD, MD 12/19/2017 9:20:51 AM This report has been signed electronically. Number of Addenda: 0 Note Initiated On: 12/19/2017 8:58 AM Scope Withdrawal Time: 0 hours 6 minutes 3  seconds  Total Procedure Duration: 0 hours 11 minutes 18 seconds       [facility]

## 2017-12-19 NOTE — Transfer of Care (Signed)
Immediate Anesthesia Transfer of Care Note  Patient: Kristopher Baird  Procedure(s) Performed: COLONOSCOPY WITH PROPOFOL (N/A Rectum)  Patient Location: PACU  Anesthesia Type: General  Level of Consciousness: awake, alert  and patient cooperative  Airway and Oxygen Therapy: Patient Spontanous Breathing and Patient connected to supplemental oxygen  Post-op Assessment: Post-op Vital signs reviewed, Patient's Cardiovascular Status Stable, Respiratory Function Stable, Patent Airway and No signs of Nausea or vomiting  Post-op Vital Signs: Reviewed and stable  Complications: No apparent anesthesia complications

## 2017-12-19 NOTE — H&P (Signed)
Kristopher Miniumarren Jahmere Bramel, MD [facility] [address] [place], [place] [address] Phone: [phone] Fax : [phone]  Primary Care Physician:  Steele Sizerrissman, Mark A, MD Primary Gastroenterologist:  Dr. Servando SnareWohl  Pre-Procedure History & Physical: HPI:  Kristopher BanisterStephen Baird is a 57 y.o. male is here for a screening colonoscopy.   Past Medical History:  Diagnosis Date  . Depression   . Hyperlipidemia   . Insomnia   . Sleep apnea     Past Surgical History:  Procedure Laterality Date  . APPENDECTOMY    . bicep repair Left   . HALLUX VALGUS CHEILECTOMY Right 05/11/2017   Procedure: HALLUX VALGUS CHEILECTOMY;  Surgeon: Gwyneth RevelsFowler, Justin, DPM;  [facility];  Service: Podiatry;  Laterality: Right;  . SPINE SURGERY    . VASECTOMY      Prior to Admission medications   Medication Sig Start Date End Date Taking? Authorizing Provider  meloxicam (MOBIC) 15 MG tablet Take 1 tablet (15 mg total) by mouth daily. Patient not taking: Reported on 12/19/2017 10/25/17   Steele Sizerrissman, Mark A, MD  sildenafil (VIAGRA) 100 MG tablet Take 1 tablet (100 mg total) by mouth as needed for erectile dysfunction. Patient not taking: Reported on 12/19/2017 10/19/16   Steele Sizerrissman, Mark A, MD    Allergies as of 12/02/2017 - Review Complete 10/25/2017  Allergen Reaction Noted  . Penicillins  06/11/2015  . Prevacid [lansoprazole] Swelling 06/11/2015    Family History  Problem Relation Age of Onset  . Cancer Mother   . Diabetes Father   . Lung disease Father   . Migraines Sister   . Cancer Brother     Social History   Socioeconomic History  . Marital status: Single    Spouse name: Not on file  . Number of children: Not on file  . Years of education: Not on file  . Highest education level: Not on file  Occupational History  . Not on file  Social Needs  . Financial resource strain: Not on file  . Food insecurity:    Worry: Not on file    Inability: Not on file  . Transportation needs:    Medical: Not on  file    Non-medical: Not on file  Tobacco Use  . Smoking status: Never Smoker  . Smokeless tobacco: Former Engineer, waterUser  Substance and Sexual Activity  . Alcohol use: Yes    Comment: may drink 1x/mo  . Drug use: No  . Sexual activity: Not on file  Lifestyle  . Physical activity:    Days per week: Not on file    Minutes per session: Not on file  . Stress: Not on file  Relationships  . Social connections:    Talks on phone: Not on file    Gets together: Not on file    Attends religious service: Not on file    Active member of club or organization: Not on file    Attends meetings of clubs or organizations: Not on file    Relationship status: Not on file  . Intimate partner violence:    Fear of current or ex partner: Not on file    Emotionally abused: Not on file    Physically abused: Not on file    Forced sexual activity: Not on file  Other Topics Concern  . Not on file  Social History Narrative  . Not on file    Review of Systems: See HPI, otherwise negative ROS  Physical Exam: BP 127/82   Pulse 69  Temp 97.8 F (36.6 C) (Temporal)   Resp 16   Ht 5\' 10"  (1.778 m)   Wt 191 lb (86.6 kg)   SpO2 100%   BMI 27.41 kg/m  General:   Alert,  pleasant and cooperative in NAD Head:  Normocephalic and atraumatic. Neck:  Supple; no masses or thyromegaly. Lungs:  Clear throughout to auscultation.    Heart:  Regular rate and rhythm. Abdomen:  Soft, nontender and nondistended. Normal bowel sounds, without guarding, and without rebound.   Neurologic:  Alert and  oriented x4;  grossly normal neurologically.  Impression/Plan: [name] is now here to undergo a screening colonoscopy.  Risks, benefits, and alternatives regarding colonoscopy have been reviewed with the patient.  Questions have been answered.  All parties agreeable.

## 2017-12-20 ENCOUNTER — Encounter: Payer: Self-pay | Admitting: Gastroenterology

## 2018-03-21 ENCOUNTER — Telehealth: Payer: Self-pay | Admitting: Family Medicine

## 2018-03-21 MED ORDER — PREDNISONE 10 MG PO TABS: ORAL_TABLET | ORAL | 0 refills | Status: DC

## 2018-03-21 NOTE — Telephone Encounter (Signed)
Copied from CRM [id number]. Topic: Inquiry >> Mar 21, 2018  8:43 AM Stephannie LiSimmons, Hermione Havlicek L, NT wrote: Reason for CRM: Patient called and would like a prescription for prednisone called in for poison ivy / oak  [facility] at [facility] [place], [place] - [address] [phone] (Phone) [phone] (Fax)     The patient CB # [phone]

## 2018-05-05 ENCOUNTER — Other Ambulatory Visit: Payer: Self-pay | Admitting: Family Medicine

## 2018-05-27 ENCOUNTER — Other Ambulatory Visit: Payer: Self-pay | Admitting: Family Medicine

## 2018-06-01 ENCOUNTER — Ambulatory Visit: Payer: BC Managed Care – PPO | Admitting: Family Medicine

## 2018-06-05 ENCOUNTER — Telehealth: Payer: Self-pay | Admitting: Family Medicine

## 2018-06-05 MED ORDER — AZITHROMYCIN 250 MG PO TABS: ORAL_TABLET | ORAL | 0 refills | Status: DC

## 2018-06-05 NOTE — Telephone Encounter (Signed)
Copied from CRM [id number]. Topic: Inquiry >> Jun 05, 2018  3:46 PM Maia Petties wrote: Blowing nose a lot and tickle in back of throat and some coughing. Pt states he cannot come in and is asking for something to be sent to the pharmacy for a sinus infection. Pt declined scheduling an appt. Please advise.  [facility] [place], [place] - [address] [phone] (Phone) [phone] (Fax)

## 2018-07-05 ENCOUNTER — Ambulatory Visit: Payer: BC Managed Care – PPO | Admitting: Family Medicine

## 2018-07-05 ENCOUNTER — Encounter: Payer: Self-pay | Admitting: Family Medicine

## 2018-07-05 DIAGNOSIS — M7062 Trochanteric bursitis, left hip: Secondary | ICD-10-CM | POA: Diagnosis not present

## 2018-07-05 DIAGNOSIS — G8929 Other chronic pain: Secondary | ICD-10-CM

## 2018-07-05 DIAGNOSIS — M7060 Trochanteric bursitis, unspecified hip: Secondary | ICD-10-CM | POA: Insufficient documentation

## 2018-07-05 DIAGNOSIS — M25552 Pain in left hip: Secondary | ICD-10-CM

## 2018-07-05 MED ORDER — SILDENAFIL CITRATE 100 MG PO TABS: 100.0000 mg | ORAL_TABLET | ORAL | 12 refills | Status: DC | PRN

## 2018-07-05 MED ORDER — MELOXICAM 15 MG PO TABS: 15.0000 mg | ORAL_TABLET | Freq: Every day | ORAL | 3 refills | Status: DC

## 2018-07-05 NOTE — Progress Notes (Signed)
BP (!) 147/83 (BP Location: Left Arm, Patient Position: Sitting, Cuff Size: Normal)   Pulse 79   Temp 98.3 F (36.8 C) (Oral)   Ht 5\' 10"  (1.778 m)   Wt 214 lb 14.4 oz (97.5 kg)   SpO2 99%   BMI 30.83 kg/m    Subjective:    Patient ID: Kristopher Baird, male    DOB: [birth year], 57 y.o.   MRN: [id number]  HPI: [name] is a 57 y.o. male  Chief Complaint  Patient presents with  . Hip Pain  . Leg Pain  . Knee Pain  . Skin Problem    Mole like on back   Patient with mole on his back reviewed and on observation seborrheic keratosis patient education given will observe. Patient with intermittent hip leg knee pain is been ongoing intermittently no known specific trauma or irritation.  Has had trochanteric bursitis in the past which responded to an injection. Patient concerned because of back surgery in 2004 may have recurrence. No other knee swelling joint locking clicking or other knee type issues. Relevant past medical, surgical, family and social history reviewed and updated as indicated. Interim medical history since our last visit reviewed. Allergies and medications reviewed and updated.  Review of Systems  Constitutional: Negative.   Respiratory: Negative.   Cardiovascular: Negative.     Per HPI unless specifically indicated above     Objective:    BP (!) 147/83 (BP Location: Left Arm, Patient Position: Sitting, Cuff Size: Normal)   Pulse 79   Temp 98.3 F (36.8 C) (Oral)   Ht 5\' 10"  (1.778 m)   Wt 214 lb 14.4 oz (97.5 kg)   SpO2 99%   BMI 30.83 kg/m   Wt Readings from Last 3 Encounters:  07/05/18 214 lb 14.4 oz (97.5 kg)  12/19/17 191 lb (86.6 kg)  10/25/17 206 lb (93.4 kg)    Physical Exam  Constitutional: He is oriented to person, place, and time. He appears well-developed and well-nourished.  HENT:  Head: Normocephalic and atraumatic.  Eyes: Conjunctivae and EOM are normal.  Neck: Normal range of motion.  Cardiovascular: Normal rate, regular  rhythm and normal heart sounds.  Pulmonary/Chest: Effort normal and breath sounds normal.  Musculoskeletal: Normal range of motion.  May be some slight tenderness over left trochanteric bursa otherwise knee hip exams normal  Neurological: He is alert and oriented to person, place, and time.  Skin: No erythema.  Seborrheic keratosis mid back  Psychiatric: He has a normal mood and affect. His behavior is normal. Judgment and thought content normal.    Results for orders placed or performed in visit on 11/25/17  CBC with Differential/Platelet  Result Value Ref Range   WBC 6.7 3.4 - 10.8 x10E3/uL   RBC 4.88 4.14 - 5.80 x10E6/uL   Hemoglobin 13.5 13.0 - 17.7 g/dL   Hematocrit 40.9 81.1 - 51.0 %   MCV 82 79 - 97 fL   MCH 27.7 26.6 - 33.0 pg   MCHC 33.7 31.5 - 35.7 g/dL   RDW 91.4 78.2 - 95.6 %   Platelets 180 150 - 379 x10E3/uL   Neutrophils 68 Not Estab. %   Lymphs 25 Not Estab. %   Monocytes 5 Not Estab. %   Eos 2 Not Estab. %   Basos 0 Not Estab. %   Neutrophils Absolute 4.6 1.4 - 7.0 x10E3/uL   Lymphocytes Absolute 1.7 0.7 - 3.1 x10E3/uL   Monocytes Absolute 0.4 0.1 - 0.9 x10E3/uL  EOS (ABSOLUTE) 0.1 0.0 - 0.4 x10E3/uL   Basophils Absolute 0.0 0.0 - 0.2 x10E3/uL   Immature Granulocytes 0 Not Estab. %   Immature Grans (Abs) 0.0 0.0 - 0.1 x10E3/uL  Reticulocytes  Result Value Ref Range   Retic Ct Pct 0.9 0.6 - 2.6 %  Iron and TIBC  Result Value Ref Range   Total Iron Binding Capacity 196 (L) 250 - 450 ug/dL   UIBC 657 846 - 962 ug/dL   Iron 52 38 - 952 ug/dL   Iron Saturation 27 15 - 55 %  Ferritin  Result Value Ref Range   Ferritin 270 30 - 400 ng/mL      Assessment & Plan:   Problem List Items Addressed This Visit      Musculoskeletal and Integument   Trochanteric bursitis    Discussed bursitis care and treatment also patient's other joint abnormalities discuss new shoes may be helpful and trial of meloxicam if problems persist will consider referral to  orthopedics.        Other   Hip pain, chronic, left   Relevant Medications   meloxicam (MOBIC) 15 MG tablet       Follow up plan: Return if symptoms worsen or fail to improve, for As scheduled.

## 2018-07-05 NOTE — Assessment & Plan Note (Signed)
Discussed bursitis care and treatment also patient's other joint abnormalities discuss new shoes may be helpful and trial of meloxicam if problems persist will consider referral to orthopedics.

## 2018-07-10 ENCOUNTER — Encounter: Payer: Self-pay | Admitting: Family Medicine

## 2018-10-26 ENCOUNTER — Encounter: Payer: BC Managed Care – PPO | Admitting: Family Medicine

## 2018-11-14 ENCOUNTER — Ambulatory Visit (INDEPENDENT_AMBULATORY_CARE_PROVIDER_SITE_OTHER): Payer: BC Managed Care – PPO | Admitting: Family Medicine

## 2018-11-14 ENCOUNTER — Encounter: Payer: Self-pay | Admitting: Family Medicine

## 2018-11-14 VITALS — BP 138/88 | HR 59 | Temp 97.5°F | Ht 69.8 in | Wt 215.4 lb

## 2018-11-14 DIAGNOSIS — M25552 Pain in left hip: Secondary | ICD-10-CM | POA: Diagnosis not present

## 2018-11-14 DIAGNOSIS — M542 Cervicalgia: Secondary | ICD-10-CM | POA: Diagnosis not present

## 2018-11-14 DIAGNOSIS — G8929 Other chronic pain: Secondary | ICD-10-CM

## 2018-11-14 DIAGNOSIS — Z0000 Encounter for general adult medical examination without abnormal findings: Secondary | ICD-10-CM

## 2018-11-14 LAB — URINALYSIS, ROUTINE W REFLEX MICROSCOPIC
Bilirubin, UA: NEGATIVE
Glucose, UA: NEGATIVE
KETONES UA: NEGATIVE
LEUKOCYTES UA: NEGATIVE
Nitrite, UA: NEGATIVE
Protein, UA: NEGATIVE
RBC UA: NEGATIVE
SPEC GRAV UA: 1.015 (ref 1.005–1.030)
Urobilinogen, Ur: 0.2 mg/dL (ref 0.2–1.0)
pH, UA: 5.5 (ref 5.0–7.5)

## 2018-11-14 MED ORDER — SILDENAFIL CITRATE 100 MG PO TABS: 100.0000 mg | ORAL_TABLET | ORAL | 12 refills | Status: DC | PRN

## 2018-11-14 MED ORDER — MELOXICAM 15 MG PO TABS: 15.0000 mg | ORAL_TABLET | Freq: Every day | ORAL | 6 refills | Status: DC

## 2018-11-14 NOTE — Progress Notes (Signed)
BP 138/88   Pulse (!) 59   Temp (!) 97.5 F (36.4 C) (Oral)   Ht 5' 9.8" (1.773 m)   Wt 215 lb 6.4 oz (97.7 kg)   SpO2 98%   BMI 31.08 kg/m    Subjective:    Patient ID: Kristopher Baird, male    DOB: 12-31-1960, 58 y.o.   MRN: [id number]  HPI: Kristopher Baird is a 58 y.o. male  Chief Complaint  Patient presents with  . Annual Exam  Patient's all in all doing okay but is has some issues that have been ongoing. In particular his neck from last month and really pretty much all year is been bothersome with no radiation has been treated with x-ray and medications at [facility] clinic walk-in with no real relief continues to be bothersome.  Symptoms mostly discomfort at the end of the day. Has taken meloxicam and Naprosyn with minimal relief.  Skelaxin may be helped a little bit. Patient also with ongoing pain in his knees and/or hips this goes on every single night wakes up with pain discomfort from 1 of his knees or hips.  Every 30 minutes or so when this is been ongoing for over a year.  Medication has not really helped that much. Reviewed notes from orthopedics for neck with physical therapy and home exercise.  This is only helped minimally.  Relevant past medical, surgical, family and social history reviewed and updated as indicated. Interim medical history since our last visit reviewed. Allergies and medications reviewed and updated.  Review of Systems  Constitutional: Negative.   HENT: Negative.   Eyes: Negative.   Respiratory: Negative.   Cardiovascular: Negative.   Gastrointestinal: Negative.   Endocrine: Negative.   Genitourinary: Negative.   Musculoskeletal: Negative.   Skin: Negative.   Allergic/Immunologic: Negative.   Neurological: Negative.   Hematological: Negative.   Psychiatric/Behavioral: Negative.     Per HPI unless specifically indicated above     Objective:    BP 138/88   Pulse (!) 59   Temp (!) 97.5 F (36.4 C) (Oral)   Ht 5' 9.8" (1.773 m)    Wt 215 lb 6.4 oz (97.7 kg)   SpO2 98%   BMI 31.08 kg/m   Wt Readings from Last 3 Encounters:  11/14/18 215 lb 6.4 oz (97.7 kg)  07/05/18 214 lb 14.4 oz (97.5 kg)  12/19/17 191 lb (86.6 kg)    Physical Exam Constitutional:      Appearance: He is well-developed.  HENT:     Head: Normocephalic and atraumatic.     Right Ear: External ear normal.     Left Ear: External ear normal.  Eyes:     Conjunctiva/sclera: Conjunctivae normal.     Pupils: Pupils are equal, round, and reactive to light.  Neck:     Musculoskeletal: Normal range of motion and neck supple.  Cardiovascular:     Rate and Rhythm: Normal rate and regular rhythm.     Heart sounds: Normal heart sounds.  Pulmonary:     Effort: Pulmonary effort is normal.     Breath sounds: Normal breath sounds.  Abdominal:     General: Bowel sounds are normal.     Palpations: Abdomen is soft. There is no hepatomegaly or splenomegaly.  Genitourinary:    Penis: Normal.      Prostate: Normal.     Rectum: Normal.  Musculoskeletal: Normal range of motion.  Skin:    Findings: No erythema or rash.  Neurological:  Mental Status: He is alert and oriented to person, place, and time.     Deep Tendon Reflexes: Reflexes are normal and symmetric.  Psychiatric:        Behavior: Behavior normal.        Thought Content: Thought content normal.        Judgment: Judgment normal.     Results for orders placed or performed in visit on 11/25/17  CBC with Differential/Platelet  Result Value Ref Range   WBC 6.7 3.4 - 10.8 x10E3/uL   RBC 4.88 4.14 - 5.80 x10E6/uL   Hemoglobin 13.5 13.0 - 17.7 g/dL   Hematocrit 17.0 01.7 - 51.0 %   MCV 82 79 - 97 fL   MCH 27.7 26.6 - 33.0 pg   MCHC 33.7 31.5 - 35.7 g/dL   RDW 49.4 49.6 - 75.9 %   Platelets 180 150 - 379 x10E3/uL   Neutrophils 68 Not Estab. %   Lymphs 25 Not Estab. %   Monocytes 5 Not Estab. %   Eos 2 Not Estab. %   Basos 0 Not Estab. %   Neutrophils Absolute 4.6 1.4 - 7.0 x10E3/uL    Lymphocytes Absolute 1.7 0.7 - 3.1 x10E3/uL   Monocytes Absolute 0.4 0.1 - 0.9 x10E3/uL   EOS (ABSOLUTE) 0.1 0.0 - 0.4 x10E3/uL   Basophils Absolute 0.0 0.0 - 0.2 x10E3/uL   Immature Granulocytes 0 Not Estab. %   Immature Grans (Abs) 0.0 0.0 - 0.1 x10E3/uL  Reticulocytes  Result Value Ref Range   Retic Ct Pct 0.9 0.6 - 2.6 %  Iron and TIBC  Result Value Ref Range   Total Iron Binding Capacity 196 (L) 250 - 450 ug/dL   UIBC 163 846 - 659 ug/dL   Iron 52 38 - 935 ug/dL   Iron Saturation 27 15 - 55 %  Ferritin  Result Value Ref Range   Ferritin 270 30 - 400 ng/mL      Assessment & Plan:   Problem List Items Addressed This Visit      Other   Hip pain, chronic, left   Neck pain    Discussed will go back to ortho to recheck neck and knees and hips.       Other Visit Diagnoses    Routine general medical examination at a health care facility    -  Primary   Relevant Orders   CBC with Differential/Platelet   Comprehensive metabolic panel   TSH   PSA   Lipid panel   Urinalysis, Routine w reflex microscopic       Follow up plan: Return in about 1 year (around 11/14/2019), or if symptoms worsen or fail to improve, for Physical Exam.

## 2018-11-14 NOTE — Assessment & Plan Note (Signed)
Discussed will go back to ortho to recheck neck and knees and hips.

## 2018-11-15 ENCOUNTER — Telehealth: Payer: Self-pay | Admitting: Family Medicine

## 2018-11-15 DIAGNOSIS — N183 Chronic kidney disease, stage 3 unspecified: Secondary | ICD-10-CM

## 2018-11-15 LAB — CBC WITH DIFFERENTIAL/PLATELET
BASOS ABS: 0 10*3/uL (ref 0.0–0.2)
Basos: 1 %
EOS (ABSOLUTE): 0.3 10*3/uL (ref 0.0–0.4)
EOS: 4 %
HEMATOCRIT: 44.7 % (ref 37.5–51.0)
HEMOGLOBIN: 14.7 g/dL (ref 13.0–17.7)
IMMATURE GRANS (ABS): 0 10*3/uL (ref 0.0–0.1)
IMMATURE GRANULOCYTES: 0 %
LYMPHS ABS: 1.4 10*3/uL (ref 0.7–3.1)
LYMPHS: 24 %
MCH: 29.1 pg (ref 26.6–33.0)
MCHC: 32.9 g/dL (ref 31.5–35.7)
MCV: 89 fL (ref 79–97)
MONOCYTES: 7 %
Monocytes Absolute: 0.4 10*3/uL (ref 0.1–0.9)
NEUTROS PCT: 64 %
Neutrophils Absolute: 3.8 10*3/uL (ref 1.4–7.0)
Platelets: 141 10*3/uL — ABNORMAL LOW (ref 150–450)
RBC: 5.05 x10E6/uL (ref 4.14–5.80)
RDW: 13.8 % (ref 11.6–15.4)
WBC: 5.9 10*3/uL (ref 3.4–10.8)

## 2018-11-15 LAB — COMPREHENSIVE METABOLIC PANEL
ALBUMIN: 4.4 g/dL (ref 3.8–4.9)
ALK PHOS: 75 IU/L (ref 39–117)
ALT: 13 IU/L (ref 0–44)
AST: 19 IU/L (ref 0–40)
Albumin/Globulin Ratio: 1.9 (ref 1.2–2.2)
BUN/Creatinine Ratio: 18 (ref 9–20)
BUN: 26 mg/dL — AB (ref 6–24)
Bilirubin Total: 0.2 mg/dL (ref 0.0–1.2)
CALCIUM: 9.3 mg/dL (ref 8.7–10.2)
CO2: 22 mmol/L (ref 20–29)
CREATININE: 1.41 mg/dL — AB (ref 0.76–1.27)
Chloride: 104 mmol/L (ref 96–106)
GFR calc Af Amer: 64 mL/min/{1.73_m2} (ref >59)
GFR, EST NON AFRICAN AMERICAN: 55 mL/min/{1.73_m2} — AB (ref >59)
GLOBULIN, TOTAL: 2.3 g/dL (ref 1.5–4.5)
GLUCOSE: 84 mg/dL (ref 65–99)
Potassium: 4.4 mmol/L (ref 3.5–5.2)
SODIUM: 142 mmol/L (ref 134–144)
TOTAL PROTEIN: 6.7 g/dL (ref 6.0–8.5)

## 2018-11-15 LAB — LIPID PANEL
CHOLESTEROL TOTAL: 197 mg/dL (ref 100–199)
Chol/HDL Ratio: 6.4 ratio — ABNORMAL HIGH (ref 0.0–5.0)
HDL: 31 mg/dL — AB (ref >39)
LDL Calculated: 127 mg/dL — ABNORMAL HIGH (ref 0–99)
TRIGLYCERIDES: 197 mg/dL — AB (ref 0–149)
VLDL CHOLESTEROL CAL: 39 mg/dL (ref 5–40)

## 2018-11-15 LAB — TSH: TSH: 3.94 u[IU]/mL (ref 0.450–4.500)

## 2018-11-15 LAB — PSA: PROSTATE SPECIFIC AG, SERUM: 1.4 ng/mL (ref 0.0–4.0)

## 2018-11-15 NOTE — Telephone Encounter (Signed)
Phone call Discussed with patient declining renal function taking meloxicam on a regular basis. Discussed taking a week on use meloxicam for 6 days then a week off using Tylenol and will observe recheck BMP 2 to 3 months.

## 2018-11-21 ENCOUNTER — Encounter: Payer: Self-pay | Admitting: Family Medicine

## 2018-11-21 DIAGNOSIS — Z113 Encounter for screening for infections with a predominantly sexual mode of transmission: Secondary | ICD-10-CM

## 2019-03-13 ENCOUNTER — Other Ambulatory Visit: Payer: Self-pay

## 2019-03-13 ENCOUNTER — Encounter: Payer: Self-pay | Admitting: Nurse Practitioner

## 2019-03-13 ENCOUNTER — Ambulatory Visit (INDEPENDENT_AMBULATORY_CARE_PROVIDER_SITE_OTHER): Payer: BC Managed Care – PPO | Admitting: Nurse Practitioner

## 2019-03-13 VITALS — BP 135/80 | HR 69 | Temp 98.5°F | Wt 219.0 lb

## 2019-03-13 DIAGNOSIS — M542 Cervicalgia: Secondary | ICD-10-CM | POA: Diagnosis not present

## 2019-03-13 MED ORDER — MELOXICAM 15 MG PO TABS: 15.0000 mg | ORAL_TABLET | Freq: Every day | ORAL | 6 refills | Status: DC

## 2019-03-13 MED ORDER — METHOCARBAMOL 500 MG PO TABS: 500.0000 mg | ORAL_TABLET | Freq: Two times a day (BID) | ORAL | 1 refills | Status: DC | PRN

## 2019-03-13 MED ORDER — CYCLOBENZAPRINE HCL 10 MG PO TABS: 10.0000 mg | ORAL_TABLET | Freq: Three times a day (TID) | ORAL | 1 refills | Status: DC | PRN
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
## 2019-03-13 NOTE — Patient Instructions (Addendum)
[address], [place], [place] [address] [phone]  Diclofenac gel   Muscle Pain, Adult Muscle pain (myalgia) may be mild or severe. In most cases, the pain lasts only a short time and it goes away without treatment. It is normal to feel some muscle pain after starting a workout program. Muscles that have not been used often will be sore at first. Muscle pain may also be caused by many other things, including:  Overuse or muscle strain, especially if you are not in shape. This is the most common cause of muscle pain.  Injury.  Bruises.  Viruses, such as the flu.  Infectious diseases.  A chronic condition that causes muscle tenderness, fatigue, and headache (fibromyalgia).  A condition, such as lupus, in which the body's disease-fighting system attacks other organs in the body (autoimmune or rheumatologic diseases).  Certain drugs, including ACE inhibitors and statins. To diagnose the cause of your muscle pain, your health care provider will do a physical exam and ask questions about the pain and when it began. If you have not had muscle pain for very long, your health care provider may want to wait before doing much testing. If your muscle pain has lasted a long time, your health care provider may want to run tests right away. In some cases, this may include tests to rule out certain conditions or illnesses. Treatment for muscle pain depends on the cause. Home care is often enough to relieve muscle pain. Your health care provider may also prescribe anti-inflammatory medicine. Follow these instructions at home: Activity  If overuse is causing your muscle pain: ? Slow down your activities until the pain goes away. ? Do regular, gentle exercises if you are not usually active. ? Warm up before exercising. Stretch before and after exercising. This can help lower the risk of muscle pain.  Do not continue working out if the pain is very bad. Bad pain could mean that you have  injured a muscle. Managing pain and discomfort   If directed, apply ice to the sore muscle: ? Put ice in a plastic bag. ? Place a towel between your skin and the bag. ? Leave the ice on for 20 minutes, 2-3 times a day.  You may also alternate between applying ice and applying heat as told by your health care provider. To apply heat, use the heat source that your health care provider recommends, such as a moist heat pack or a heating pad. ? Place a towel between your skin and the heat source. ? Leave the heat on for 20-30 minutes. ? Remove the heat if your skin turns bright red. This is especially important if you are unable to feel pain, heat, or cold. You may have a greater risk of getting burned. Medicines  Take over-the-counter and prescription medicines only as told by your health care provider.  Do not drive or use heavy machinery while taking prescription pain medicine. Contact a health care provider if:  Your muscle pain gets worse and medicines do not help.  You have muscle pain that lasts longer than 3 days.  You have a rash or fever along with muscle pain.  You have muscle pain after a tick bite.  You have muscle pain while working out, even though you are in good physical condition.  You have redness, soreness, or swelling along with muscle pain.  You have muscle pain after starting a new medicine or changing the dose of a medicine. Get help right away  if:  You have trouble breathing.  You have trouble swallowing.  You have muscle pain along with a stiff neck, fever, and vomiting.  You have severe muscle weakness or cannot move part of your body. This information is not intended to replace advice given to you by your health care provider. Make sure you discuss any questions you have with your health care provider. Document Released: 07/15/2006 Document Revised: 08/05/2017 Document Reviewed: 01/13/2016 Elsevier Patient Education  2020 [employer].

## 2019-03-13 NOTE — Assessment & Plan Note (Addendum)
Discussed plan with Dr. Wynetta Emery and had her assess area of edema left side neck.  Will order U/S of soft tissue neck left side.  Also ordered MRI neck due to ongoing pain, which is now to left side of neck, and previous calcification noted on xrays.  Scripts for Robaxin (daytime use) and Flexeril (nighttime use only) to take as needed.  Recommend use of Diclofenac gel as needed, can get OTC.  May also use small amount of Ibuprofen or APAP as needed.  Alternate ice and heat.  Return in 2 weeks for f/u.

## 2019-03-15 ENCOUNTER — Encounter: Payer: Self-pay | Admitting: Family Medicine

## 2019-03-15 NOTE — Telephone Encounter (Signed)
I do not see any bloodwork that was ordered. Am I correct? Pleasea dvise.

## 2019-03-19 ENCOUNTER — Ambulatory Visit
Admission: RE | Admit: 2019-03-19 | Discharge: 2019-03-19 | Disposition: A | Discharge status: Home / Self Care | Payer: BC Managed Care – PPO | Source: Ambulatory Visit | Attending: Nurse Practitioner | Admitting: Nurse Practitioner

## 2019-03-19 ENCOUNTER — Other Ambulatory Visit: Payer: Self-pay

## 2019-03-19 DIAGNOSIS — M542 Cervicalgia: Secondary | ICD-10-CM | POA: Insufficient documentation

## 2019-03-19 NOTE — Telephone Encounter (Signed)
Spoke to patient on phone and he is aware someone will call and update him on MRI referral status.  Thanks.

## 2019-04-07 ENCOUNTER — Encounter: Payer: Self-pay | Admitting: Family Medicine

## 2019-04-09 ENCOUNTER — Other Ambulatory Visit: Payer: Self-pay | Admitting: Nurse Practitioner

## 2019-04-09 DIAGNOSIS — M542 Cervicalgia: Secondary | ICD-10-CM

## 2019-04-09 MED ORDER — METHOCARBAMOL 500 MG PO TABS: 500.0000 mg | ORAL_TABLET | Freq: Two times a day (BID) | ORAL | 1 refills | Status: DC | PRN

## 2019-04-09 MED ORDER — CYCLOBENZAPRINE HCL 10 MG PO TABS: 10.0000 mg | ORAL_TABLET | Freq: Three times a day (TID) | ORAL | 1 refills | Status: DC | PRN

## 2019-04-19 ENCOUNTER — Ambulatory Visit: Payer: Self-pay | Admitting: Family Medicine

## 2019-04-19 ENCOUNTER — Telehealth: Payer: Self-pay

## 2019-04-19 NOTE — Telephone Encounter (Signed)
Copied from [place] [id number]. Topic: General - Other >> Apr 19, 2019  3:44 PM Parke Poisson wrote: Reason for CRM: Insurance company would like to speak peer to peer to get MRI of neck approved.Phone [phone]. Member # [id number]

## 2019-04-19 NOTE — Telephone Encounter (Signed)
Routing to provider. Appointment is for Monday.

## 2019-04-20 NOTE — Telephone Encounter (Signed)
Spoke to [facility].  Appears that he had two MRI orders, one for cervical spine ordered by chiropractor which they report was approved in July and one from me.  I recommended they maintain the one from his chiropractor and will discontinue my order in this case, since chiropractor order was approved from what the insurance company has told me.  I have sent MyChart message to patient about this and information I was told and inquired into whether he has gone for one ordered by his chiropractor.

## 2019-04-23 ENCOUNTER — Inpatient Hospital Stay: Admission: RE | Admit: 2019-04-23 | Payer: BC Managed Care – PPO | Source: Ambulatory Visit

## 2019-04-23 ENCOUNTER — Ambulatory Visit
Admission: RE | Admit: 2019-04-23 | Discharge: 2019-04-23 | Disposition: A | Discharge status: Home / Self Care | Payer: BC Managed Care – PPO | Source: Ambulatory Visit | Attending: Chiropractic Medicine | Admitting: Chiropractic Medicine

## 2019-04-23 ENCOUNTER — Other Ambulatory Visit: Payer: Self-pay | Admitting: Chiropractic Medicine

## 2019-04-23 DIAGNOSIS — M542 Cervicalgia: Secondary | ICD-10-CM

## 2019-04-25 ENCOUNTER — Encounter: Payer: Self-pay | Admitting: Family Medicine

## 2019-04-25 ENCOUNTER — Ambulatory Visit (INDEPENDENT_AMBULATORY_CARE_PROVIDER_SITE_OTHER): Payer: BC Managed Care – PPO | Admitting: Family Medicine

## 2019-04-25 ENCOUNTER — Other Ambulatory Visit: Payer: Self-pay

## 2019-04-25 DIAGNOSIS — M5000 Cervical disc disorder with myelopathy, unspecified cervical region: Secondary | ICD-10-CM | POA: Diagnosis not present

## 2019-04-25 MED ORDER — PREDNISONE 10 MG PO TABS: ORAL_TABLET | ORAL | 0 refills | Status: DC

## 2019-04-25 MED ORDER — TRAMADOL HCL 50 MG PO TABS: 50.0000 mg | ORAL_TABLET | Freq: Three times a day (TID) | ORAL | 0 refills | Status: AC | PRN

## 2019-04-25 NOTE — Assessment & Plan Note (Signed)
From MRI yesterday worsening cervical disc disorder and increasing pain will refer to neurosurgery. Discussed medication care and treatment will start prednisone and tramadol.

## 2019-04-25 NOTE — Progress Notes (Addendum)
There were no vitals taken for this visit.   Subjective:    Patient ID: Kristopher Baird, male    DOB: 1961-03-21, 58 y.o.   MRN: [id number]  HPI: Kristopher Baird is a 58 y.o. male  Med check Discussed with patient MRI report showing cervical disc disease. MRI showing right-sided problems patient symptoms are on the left will discuss with radiology and concur. Reviewed with patient his symptoms consistent with spinal stenosis and cervical disc disease. Reviewed been doing physical therapy and chiropractic care.  With no relief.  Relevant past medical, surgical, family and social history reviewed and updated as indicated. Interim medical history since our last visit reviewed. Allergies and medications reviewed and updated.  Review of Systems  Constitutional: Negative.   Respiratory: Negative.   Cardiovascular: Negative.     Per HPI unless specifically indicated above     Objective:    There were no vitals taken for this visit.  Wt Readings from Last 3 Encounters:  03/13/19 219 lb (99.3 kg)  11/14/18 215 lb 6.4 oz (97.7 kg)  07/05/18 214 lb 14.4 oz (97.5 kg)    Physical Exam  Results for orders placed or performed in visit on 11/14/18  CBC with Differential/Platelet  Result Value Ref Range   WBC 5.9 3.4 - 10.8 x10E3/uL   RBC 5.05 4.14 - 5.80 x10E6/uL   Hemoglobin 14.7 13.0 - 17.7 g/dL   Hematocrit 44.7 37.5 - 51.0 %   MCV 89 79 - 97 fL   MCH 29.1 26.6 - 33.0 pg   MCHC 32.9 31.5 - 35.7 g/dL   RDW 13.8 11.6 - 15.4 %   Platelets 141 (L) 150 - 450 x10E3/uL   Neutrophils 64 Not Estab. %   Lymphs 24 Not Estab. %   Monocytes 7 Not Estab. %   Eos 4 Not Estab. %   Basos 1 Not Estab. %   Neutrophils Absolute 3.8 1.4 - 7.0 x10E3/uL   Lymphocytes Absolute 1.4 0.7 - 3.1 x10E3/uL   Monocytes Absolute 0.4 0.1 - 0.9 x10E3/uL   EOS (ABSOLUTE) 0.3 0.0 - 0.4 x10E3/uL   Basophils Absolute 0.0 0.0 - 0.2 x10E3/uL   Immature Granulocytes 0 Not Estab. %   Immature Grans (Abs) 0.0  0.0 - 0.1 x10E3/uL  Comprehensive metabolic panel  Result Value Ref Range   Glucose 84 65 - 99 mg/dL   BUN 26 (H) 6 - 24 mg/dL   Creatinine, Ser 1.41 (H) 0.76 - 1.27 mg/dL   GFR calc non Af Amer 55 (L) >59 mL/min/1.73   GFR calc Af Amer 64 >59 mL/min/1.73   BUN/Creatinine Ratio 18 9 - 20   Sodium 142 134 - 144 mmol/L   Potassium 4.4 3.5 - 5.2 mmol/L   Chloride 104 96 - 106 mmol/L   CO2 22 20 - 29 mmol/L   Calcium 9.3 8.7 - 10.2 mg/dL   Total Protein 6.7 6.0 - 8.5 g/dL   Albumin 4.4 3.8 - 4.9 g/dL   Globulin, Total 2.3 1.5 - 4.5 g/dL   Albumin/Globulin Ratio 1.9 1.2 - 2.2   Bilirubin Total 0.2 0.0 - 1.2 mg/dL   Alkaline Phosphatase 75 39 - 117 IU/L   AST 19 0 - 40 IU/L   ALT 13 0 - 44 IU/L  TSH  Result Value Ref Range   TSH 3.940 0.450 - 4.500 uIU/mL  PSA  Result Value Ref Range   Prostate Specific Ag, Serum 1.4 0.0 - 4.0 ng/mL  Lipid panel  Result Value Ref  Range   Cholesterol, Total 197 100 - 199 mg/dL   Triglycerides 960197 (H) 0 - 149 mg/dL   HDL 31 (L) >45>39 mg/dL   VLDL Cholesterol Cal 39 5 - 40 mg/dL   LDL Calculated 409127 (H) 0 - 99 mg/dL   Chol/HDL Ratio 6.4 (H) 0.0 - 5.0 ratio  Urinalysis, Routine w reflex microscopic  Result Value Ref Range   Specific Gravity, UA 1.015 1.005 - 1.030   pH, UA 5.5 5.0 - 7.5   Color, UA Yellow Yellow   Appearance Ur Clear Clear   Leukocytes, UA Negative Negative   Protein, UA Negative Negative/Trace   Glucose, UA Negative Negative   Ketones, UA Negative Negative   RBC, UA Negative Negative   Bilirubin, UA Negative Negative   Urobilinogen, Ur 0.2 0.2 - 1.0 mg/dL   Nitrite, UA Negative Negative      Assessment & Plan:   Problem List Items Addressed This Visit      Nervous and Auditory   Intervertebral cervical disc disorder with myelopathy, cervical region    From MRI yesterday worsening cervical disc disorder and increasing pain will refer to neurosurgery. Discussed medication care and treatment will start prednisone and  tramadol.         Telemedicine using audio/video telecommunications for a synchronous communication visit. Today's visit due to COVID-19 isolation precautions I connected with and verified that I am speaking with the correct person using two identifiers.   I discussed the limitations, risks, security and privacy concerns of performing an evaluation and management service by telecommunication and the availability of in person appointments. I also discussed with the patient that there may be a patient responsible charge related to this service. The patient expressed understanding and agreed to proceed. The patient's location is home. I am at home.   I discussed the assessment and treatment plan with the patient. The patient was provided an opportunity to ask questions and all were answered. The patient agreed with the plan and demonstrated an understanding of the instructions.   The patient was advised to call back or seek an in-person evaluation if the symptoms worsen or if the condition fails to improve as anticipated.   I provided 21+ minutes of time during this encounter. Follow up plan: Return if symptoms worsen or fail to improve.

## 2019-04-26 ENCOUNTER — Encounter: Payer: Self-pay | Admitting: Family Medicine

## 2019-04-26 NOTE — Telephone Encounter (Unsigned)
Copied from [place] [id number]. Topic: General - Other >> Apr 26, 2019  2:34 PM Ivar Drape wrote: Reason for CRM: Dr. Ollen Gross w/[facility] [phone] is trying to reach Dr. Jeananne Rama about the patient.

## 2019-04-27 NOTE — Telephone Encounter (Signed)
CT scan was obtained.

## 2019-05-28 ENCOUNTER — Encounter: Payer: Self-pay | Admitting: Family Medicine

## 2019-05-30 ENCOUNTER — Other Ambulatory Visit: Payer: Self-pay | Admitting: Family Medicine

## 2019-05-30 DIAGNOSIS — N183 Chronic kidney disease, stage 3 unspecified: Secondary | ICD-10-CM

## 2019-05-30 DIAGNOSIS — M542 Cervicalgia: Secondary | ICD-10-CM

## 2019-05-30 MED ORDER — CYCLOBENZAPRINE HCL 10 MG PO TABS
10.0000 mg | ORAL_TABLET | Freq: Three times a day (TID) | ORAL | 1 refills | Status: DC | PRN
Start: 2019-05-30 — End: 2019-09-14

## 2019-05-30 MED ORDER — TRAMADOL HCL 50 MG PO TABS: 50.0000 mg | ORAL_TABLET | Freq: Three times a day (TID) | ORAL | 0 refills | Status: DC | PRN

## 2019-05-30 MED ORDER — METHOCARBAMOL 500 MG PO TABS: 500.0000 mg | ORAL_TABLET | Freq: Two times a day (BID) | ORAL | 1 refills | Status: DC | PRN

## 2019-05-30 NOTE — Progress Notes (Signed)
Phone call Discussed with patient has MRI scheduled also of his neck clavicle and axillary area with some swelling noted will check CBC and gave refills on medications including tramadol.

## 2019-05-31 ENCOUNTER — Other Ambulatory Visit: Payer: Self-pay

## 2019-05-31 ENCOUNTER — Other Ambulatory Visit: Payer: BC Managed Care – PPO

## 2019-05-31 DIAGNOSIS — N183 Chronic kidney disease, stage 3 unspecified: Secondary | ICD-10-CM

## 2019-06-01 LAB — CBC WITH DIFFERENTIAL/PLATELET
Basophils Absolute: 0 10*3/uL (ref 0.0–0.2)
Basos: 1 %
EOS (ABSOLUTE): 0.1 10*3/uL (ref 0.0–0.4)
Eos: 2 %
Hematocrit: 45.7 % (ref 37.5–51.0)
Hemoglobin: 15 g/dL (ref 13.0–17.7)
Immature Grans (Abs): 0 10*3/uL (ref 0.0–0.1)
Immature Granulocytes: 0 %
Lymphocytes Absolute: 1.6 10*3/uL (ref 0.7–3.1)
Lymphs: 27 %
MCH: 28.5 pg (ref 26.6–33.0)
MCHC: 32.8 g/dL (ref 31.5–35.7)
MCV: 87 fL (ref 79–97)
Monocytes Absolute: 0.4 10*3/uL (ref 0.1–0.9)
Monocytes: 7 %
Neutrophils Absolute: 3.7 10*3/uL (ref 1.4–7.0)
Neutrophils: 63 %
Platelets: 161 10*3/uL (ref 150–450)
RBC: 5.26 x10E6/uL (ref 4.14–5.80)
RDW: 13.6 % (ref 11.6–15.4)
WBC: 5.9 10*3/uL (ref 3.4–10.8)

## 2019-06-04 ENCOUNTER — Encounter: Payer: Self-pay | Admitting: Family Medicine

## 2019-06-04 ENCOUNTER — Other Ambulatory Visit: Payer: Self-pay

## 2019-06-04 MED ORDER — TRAMADOL HCL 50 MG PO TABS: 50.0000 mg | ORAL_TABLET | Freq: Three times a day (TID) | ORAL | 0 refills | Status: AC | PRN

## 2019-06-21 ENCOUNTER — Other Ambulatory Visit: Payer: Self-pay | Admitting: Physical Medicine and Rehabilitation

## 2019-06-21 DIAGNOSIS — R0602 Shortness of breath: Secondary | ICD-10-CM

## 2019-06-22 ENCOUNTER — Encounter: Payer: Self-pay | Admitting: Family Medicine

## 2019-06-25 ENCOUNTER — Telehealth: Payer: Self-pay | Admitting: *Deleted

## 2019-06-25 ENCOUNTER — Telehealth: Payer: Self-pay

## 2019-06-25 DIAGNOSIS — L989 Disorder of the skin and subcutaneous tissue, unspecified: Secondary | ICD-10-CM

## 2019-06-25 NOTE — Telephone Encounter (Signed)
Patient called wishing to self-refer to oncology and requesting Dr. Benay Spice. He is unaware of what type of cancer he has, but reports Dr. Dema Severin informed him he needed an oncologist. Has had cervical MRI and CT scan is planned for 06/28/19. There has been no confirmed cancer on scan or tissue diagnosis as of yet. Informed him that his PCP or Dr. Dema Severin needs to arrange for biopsy of the neck mass to confirm he has cancer and then have office send a referral in to office. He verbalized frustration and states "I feel like I'm getting the run around". Informed him that I will send a message to Dr. Jeananne Rama regarding his concerns.

## 2019-06-25 NOTE — Telephone Encounter (Signed)
Can we see if we can get any records on this and why this is requested?

## 2019-06-25 NOTE — Telephone Encounter (Signed)
Copied from [place] [id number]. Topic: Referral - Request for Referral >> Jun 22, 2019  3:22 PM Erick Blinks wrote: Has patient seen PCP for this complaint? Yes.   *If NO, is insurance requiring patient see PCP for this issue before PCP can refer them? Referral for which specialty: Oncology  Preferred provider/office: Highest recommended  Reason for referral: Per request of radiology  Best contact: [phone] extension [place] (Neurosurgery)

## 2019-06-25 NOTE — Telephone Encounter (Signed)
-----   Message from Valerie Roys, DO sent at 06/25/2019 12:58 PM EDT ----- Regarding: FW: Referral to Oncology I got a message from Dr. Gearldine Shown nurse- he states that Dr. Dema Severin would like him to see an oncologist. No notes available in care everywhere available. Oncology needs to know where cancer is/if there's cancer- can we please try to get notes from Dr. Dema Severin ([facility]- I think) and possibly schedule patient for appointment so that we can get him biopsy if needed? Thanks!  ----- Message ----- From: Tania Ade, RN Sent: 06/25/2019  10:30 AM EDT To: Guadalupe Maple, MD Subject: Referral to Oncology                           Dr. Jeananne Rama, This patient is calling Dr. Gearldine Shown office stating he needs a oncologist, but he does not appear to have a cancer diagnosis. I informed him that we would need tissue confirmation that this mass on his neck is actually cancer before he would be seen here. Is there a biopsy being planned for this? He sounds very stressed about this and verbalized that the felt he "was getting the run around".  Thank you, [place]

## 2019-06-25 NOTE — Telephone Encounter (Signed)
Phone call Discussed with patient has developed a nodule at the base of his clavicle and neck area which is becoming painful neurosurgery Dr. Brien Few recommended further testing and oncology. Oncology says they do not have a cancer diagnosis and does not want to see him yet. Will refer to ear nose and throat to further evaluate.

## 2019-06-25 NOTE — Telephone Encounter (Signed)
Noted, will review

## 2019-06-25 NOTE — Telephone Encounter (Signed)
Per Dr. Wynetta Emery- to wait until appt with Jolene on Wed. FYI Jolene

## 2019-06-25 NOTE — Telephone Encounter (Signed)
Will try to get records from [facility] [phone]

## 2019-06-28 ENCOUNTER — Ambulatory Visit
Admission: RE | Admit: 2019-06-28 | Discharge: 2019-06-28 | Disposition: A | Discharge status: Home / Self Care | Payer: BC Managed Care – PPO | Source: Ambulatory Visit | Attending: Physical Medicine and Rehabilitation | Admitting: Physical Medicine and Rehabilitation

## 2019-06-28 ENCOUNTER — Ambulatory Visit (INDEPENDENT_AMBULATORY_CARE_PROVIDER_SITE_OTHER): Payer: BC Managed Care – PPO | Admitting: Family Medicine

## 2019-06-28 ENCOUNTER — Encounter: Payer: Self-pay | Admitting: Family Medicine

## 2019-06-28 ENCOUNTER — Ambulatory Visit: Payer: BC Managed Care – PPO | Admitting: Nurse Practitioner

## 2019-06-28 ENCOUNTER — Other Ambulatory Visit: Payer: Self-pay

## 2019-06-28 VITALS — BP 153/105 | HR 89 | Temp 98.9°F | Ht 69.8 in | Wt 221.0 lb

## 2019-06-28 DIAGNOSIS — R0602 Shortness of breath: Secondary | ICD-10-CM

## 2019-06-28 DIAGNOSIS — L989 Disorder of the skin and subcutaneous tissue, unspecified: Secondary | ICD-10-CM

## 2019-06-28 LAB — BASIC METABOLIC PANEL
BUN/Creatinine Ratio: 15 (ref 9–20)
BUN: 19 mg/dL (ref 6–24)
CO2: 25 mmol/L (ref 20–29)
Calcium: 10.5 mg/dL — ABNORMAL HIGH (ref 8.7–10.2)
Chloride: 104 mmol/L (ref 96–106)
Creatinine, Ser: 1.28 mg/dL — ABNORMAL HIGH (ref 0.76–1.27)
GFR calc Af Amer: 71 mL/min/{1.73_m2} (ref >59)
GFR calc non Af Amer: 62 mL/min/{1.73_m2} (ref >59)
Glucose: 85 mg/dL (ref 65–99)
Potassium: 4.3 mmol/L (ref 3.5–5.2)
Sodium: 140 mmol/L (ref 134–144)

## 2019-06-28 MED ORDER — IOPAMIDOL (ISOVUE-370) INJECTION 76%
75.0000 mL | Freq: Once | INTRAVENOUS | Status: AC | PRN
  Administered 2019-06-28: 16:00:00 75 mL via INTRAVENOUS

## 2019-06-28 MED ORDER — GABAPENTIN 100 MG PO CAPS: 100.0000 mg | ORAL_CAPSULE | Freq: Two times a day (BID) | ORAL | 3 refills | Status: DC

## 2019-06-28 NOTE — Progress Notes (Signed)
BP (!) 153/105   Pulse 89   Temp 98.9 F (37.2 C) (Oral)   Ht 5' 9.8" (1.773 m)   Wt 221 lb (100.2 kg)   SpO2 96%   BMI 31.89 kg/m    Subjective:    Patient ID: Kristopher Baird, male    DOB: 09/11/1960, 58 y.o.   MRN: [id number]  HPI: Kristopher Baird is a 58 y.o. male  Chief Complaint  Patient presents with  . Mass    left side neck. painful    LUMP Duration: months Location: L neck Onset: gradual Painful: yes Discomfort: yes Status:  unknown Trauma: no Redness: no Bruising: no Recent infection: no Swollen lymph nodes: yes Requesting removal: yes History of cancer: no Family history of cancer: no History of the same: no  Having panic attacks- waking up at night with them. Has appointment with ENT today at [facility] and to have a CT with his neurosurgeon at 4. He has been in a lot of pain. He states that he has been taking 2 tramadol for the pain. He is otherwise doing OK, but he is very concerned that there is something very wrong going on.  Relevant past medical, surgical, family and social history reviewed and updated as indicated. Interim medical history since our last visit reviewed. Allergies and medications reviewed and updated.  Review of Systems  Constitutional: Negative.   Respiratory: Negative.   Cardiovascular: Negative.   Musculoskeletal: Positive for neck pain. Negative for arthralgias, back pain, gait problem, joint swelling, myalgias and neck stiffness.  Skin: Negative.   Neurological: Negative.   Psychiatric/Behavioral: Negative.    Per HPI unless specifically indicated above     Objective:    BP (!) 153/105   Pulse 89   Temp 98.9 F (37.2 C) (Oral)   Ht 5' 9.8" (1.773 m)   Wt 221 lb (100.2 kg)   SpO2 96%   BMI 31.89 kg/m   Wt Readings from Last 3 Encounters:  06/28/19 221 lb (100.2 kg)  03/13/19 219 lb (99.3 kg)  11/14/18 215 lb 6.4 oz (97.7 kg)    Physical Exam Vitals signs and nursing note reviewed.  Constitutional:    General: He is not in acute distress.    Appearance: Normal appearance. He is not ill-appearing, toxic-appearing or diaphoretic.  HENT:     Head: Normocephalic and atraumatic.     Right Ear: External ear normal.     Left Ear: External ear normal.     Nose: Nose normal.     Mouth/Throat:     Mouth: Mucous membranes are moist.     Pharynx: Oropharynx is clear.  Eyes:     General: No scleral icterus.       Right eye: No discharge.        Left eye: No discharge.     Extraocular Movements: Extraocular movements intact.     Conjunctiva/sclera: Conjunctivae normal.     Pupils: Pupils are equal, round, and reactive to light.  Neck:     Musculoskeletal: Normal range of motion and neck supple.  Cardiovascular:     Rate and Rhythm: Normal rate and regular rhythm.     Pulses: Normal pulses.     Heart sounds: Normal heart sounds. No murmur. No friction rub. No gallop.   Pulmonary:     Effort: Pulmonary effort is normal. No respiratory distress.     Breath sounds: Normal breath sounds. No stridor. No wheezing, rhonchi or rales.  Chest:     Chest  wall: No tenderness.  Musculoskeletal: Normal range of motion.  Skin:    General: Skin is warm and dry.     Capillary Refill: Capillary refill takes less than 2 seconds.     Coloration: Skin is not jaundiced or pale.     Findings: No bruising, erythema, lesion or rash.  Neurological:     General: No focal deficit present.     Mental Status: He is alert and oriented to person, place, and time. Mental status is at baseline.  Psychiatric:        Mood and Affect: Mood normal.        Behavior: Behavior normal.        Thought Content: Thought content normal.        Judgment: Judgment normal.     Results for orders placed or performed in visit on 00/86/76  Basic metabolic panel  Result Value Ref Range   Glucose 85 65 - 99 mg/dL   BUN 19 6 - 24 mg/dL   Creatinine, Ser 1.28 (H) 0.76 - 1.27 mg/dL   GFR calc non Af Amer 62 >59 mL/min/1.73   GFR  calc Af Amer 71 >59 mL/min/1.73   BUN/Creatinine Ratio 15 9 - 20   Sodium 140 134 - 144 mmol/L   Potassium 4.3 3.5 - 5.2 mmol/L   Chloride 104 96 - 106 mmol/L   CO2 25 20 - 29 mmol/L   Calcium 10.5 (H) 8.7 - 10.2 mg/dL      Assessment & Plan:   Problem List Items Addressed This Visit      Other   Lesion of neck - Primary    Seeing ENT today- will hopefully set up a biopsy ASAP to see if it's a lipoma or a liposarcoma. Continue to follow with ENT and neurosurgery. Will start him gabapentin as it seem like this may be a nerve pain. Recheck 2 weeks. Call with any concerns. Continue to monitor.       Relevant Orders   Basic metabolic panel (Completed)       Follow up plan: Return in about 4 weeks (around 07/26/2019).

## 2019-06-28 NOTE — Patient Instructions (Addendum)
Lipoma  A lipoma is a noncancerous (benign) tumor that is made up of fat cells. This is a very common type of soft-tissue growth. Lipomas are usually found under the skin (subcutaneous). They may occur in any tissue of the body that contains fat. Common areas for lipomas to appear include the back, shoulders, buttocks, and thighs.  Lipomas grow slowly, and they are usually painless. Most lipomas do not cause problems and do not require treatment. What are the causes? The cause of this condition is not known. What increases the risk? You are more likely to develop this condition if:  You are 61-12 years old.  You have a family history of lipomas. What are the signs or symptoms? A lipoma usually appears as a small, round bump under the skin. In most cases, the lump will:  Feel soft or rubbery.  Not cause pain or other symptoms. However, if a lipoma is located in an area where it pushes on nerves, it can become painful or cause other symptoms. How is this diagnosed? A lipoma can usually be diagnosed with a physical exam. You may also have tests to confirm the diagnosis and to rule out other conditions. Tests may include:  Imaging tests, such as a CT scan or MRI.  Removal of a tissue sample to be looked at under a microscope (biopsy). How is this treated? Treatment for this condition depends on the size of the lipoma and whether it is causing any symptoms.  For small lipomas that are not causing problems, no treatment is needed.  If a lipoma is bigger or it causes problems, surgery may be done to remove the lipoma. Lipomas can also be removed to improve appearance. Most often, the procedure is done after applying a medicine that numbs the area (local anesthetic). Follow these instructions at home:  Watch your lipoma for any changes.  Keep all follow-up visits as told by your health care provider. This is important. Contact a health care provider if:  Your lipoma becomes larger or  hard.  Your lipoma becomes painful, red, or increasingly swollen. These could be signs of infection or a more serious condition. Get help right away if:  You develop tingling or numbness in an area near the lipoma. This could indicate that the lipoma is causing nerve damage. Summary  A lipoma is a noncancerous tumor that is made up of fat cells.  Most lipomas do not cause problems and do not require treatment.  If a lipoma is bigger or it causes problems, surgery may be done to remove the lipoma. This information is not intended to replace advice given to you by your health care provider. Make sure you discuss any questions you have with your health care provider. Document Released: 08/13/2002 Document Revised: 08/09/2017 Document Reviewed: 08/09/2017 Elsevier Patient Education  [place] DASH stands for "Dietary Approaches to Stop Hypertension." The DASH eating plan is a healthy eating plan that has been shown to reduce high blood pressure (hypertension). It may also reduce your risk for type 2 diabetes, heart disease, and stroke. The DASH eating plan may also help with weight loss. What are tips for following this plan?  General guidelines  Avoid eating more than 2,300 mg (milligrams) of salt (sodium) a day. If you have hypertension, you may need to reduce your sodium intake to 1,500 mg a day.  Limit alcohol intake to no more than 1 drink a day for nonpregnant women and 2 drinks a  day for men. One drink equals 12 oz of beer, 5 oz of wine, or 1 oz of hard liquor.  Work with your health care provider to maintain a healthy body weight or to lose weight. Ask what an ideal weight is for you.  Get at least 30 minutes of exercise that causes your heart to beat faster (aerobic exercise) most days of the week. Activities may include walking, swimming, or biking.  Work with your health care provider or diet and nutrition specialist (dietitian) to adjust your eating  plan to your individual calorie needs. Reading food labels   Check food labels for the amount of sodium per serving. Choose foods with less than 5 percent of the Daily Value of sodium. Generally, foods with less than 300 mg of sodium per serving fit into this eating plan.  To find whole grains, look for the word "whole" as the first word in the ingredient list. Shopping  Buy products labeled as "low-sodium" or "no salt added."  Buy fresh foods. Avoid canned foods and premade or frozen meals. Cooking  Avoid adding salt when cooking. Use salt-free seasonings or herbs instead of table salt or sea salt. Check with your health care provider or pharmacist before using salt substitutes.  Do not fry foods. Cook foods using healthy methods such as baking, boiling, grilling, and broiling instead.  Cook with heart-healthy oils, such as olive, canola, soybean, or sunflower oil. Meal planning  Eat a balanced diet that includes: ? 5 or more servings of fruits and vegetables each day. At each meal, try to fill half of your plate with fruits and vegetables. ? Up to 6-8 servings of whole grains each day. ? Less than 6 oz of lean meat, poultry, or fish each day. A 3-oz serving of meat is about the same size as a deck of cards. One egg equals 1 oz. ? 2 servings of low-fat dairy each day. ? A serving of nuts, seeds, or beans 5 times each week. ? Heart-healthy fats. Healthy fats called Omega-3 fatty acids are found in foods such as flaxseeds and coldwater fish, like sardines, salmon, and mackerel.  Limit how much you eat of the following: ? Canned or prepackaged foods. ? Food that is high in trans fat, such as fried foods. ? Food that is high in saturated fat, such as fatty meat. ? Sweets, desserts, sugary drinks, and other foods with added sugar. ? Full-fat dairy products.  Do not salt foods before eating.  Try to eat at least 2 vegetarian meals each week.  Eat more home-cooked food and less  restaurant, buffet, and fast food.  When eating at a restaurant, ask that your food be prepared with less salt or no salt, if possible. What foods are recommended? The items listed may not be a complete list. Talk with your dietitian about what dietary choices are best for you. Grains Whole-grain or whole-wheat bread. Whole-grain or whole-wheat pasta. Brown rice. [place] Cobbatmeal. Quinoa. Bulgur. Whole-grain and low-sodium cereals. Pita bread. Low-fat, low-sodium crackers. Whole-wheat flour tortillas. Vegetables Fresh or frozen vegetables (raw, steamed, roasted, or grilled). Low-sodium or reduced-sodium tomato and vegetable juice. Low-sodium or reduced-sodium tomato sauce and tomato paste. Low-sodium or reduced-sodium canned vegetables. Fruits All fresh, dried, or frozen fruit. Canned fruit in natural juice (without added sugar). Meat and other protein foods Skinless chicken or [place]. Ground chicken or [place]. Pork with fat trimmed off. Fish and seafood. Egg whites. Dried beans, peas, or lentils. Unsalted nuts, nut butters, and seeds.  Unsalted canned beans. Lean cuts of beef with fat trimmed off. Low-sodium, lean deli meat. Dairy Low-fat (1%) or fat-free (skim) milk. Fat-free, low-fat, or reduced-fat cheeses. Nonfat, low-sodium ricotta or cottage cheese. Low-fat or nonfat yogurt. Low-fat, low-sodium cheese. Fats and oils Soft margarine without trans fats. Vegetable oil. Low-fat, reduced-fat, or light mayonnaise and salad dressings (reduced-sodium). Canola, safflower, olive, soybean, and sunflower oils. Avocado. Seasoning and other foods Herbs. Spices. Seasoning mixes without salt. Unsalted popcorn and pretzels. Fat-free sweets. What foods are not recommended? The items listed may not be a complete list. Talk with your dietitian about what dietary choices are best for you. Grains Baked goods made with fat, such as croissants, muffins, or some breads. Dry pasta or rice meal packs. Vegetables Creamed or  fried vegetables. Vegetables in a cheese sauce. Regular canned vegetables (not low-sodium or reduced-sodium). Regular canned tomato sauce and paste (not low-sodium or reduced-sodium). Regular tomato and vegetable juice (not low-sodium or reduced-sodium). Rosita Fire. Olives. Fruits Canned fruit in a light or heavy syrup. Fried fruit. Fruit in cream or butter sauce. Meat and other protein foods Fatty cuts of meat. Ribs. Fried meat. Tomasa Blase. Sausage. Bologna and other processed lunch meats. Salami. Fatback. Hotdogs. Bratwurst. Salted nuts and seeds. Canned beans with added salt. Canned or smoked fish. Whole eggs or egg yolks. Chicken or [place] with skin. Dairy Whole or 2% milk, cream, and half-and-half. Whole or full-fat cream cheese. Whole-fat or sweetened yogurt. Full-fat cheese. Nondairy creamers. Whipped toppings. Processed cheese and cheese spreads. Fats and oils Butter. Stick margarine. Lard. Shortening. Ghee. Bacon fat. Tropical oils, such as coconut, palm kernel, or palm oil. Seasoning and other foods Salted popcorn and pretzels. Onion salt, garlic salt, seasoned salt, table salt, and sea salt. Worcestershire sauce. Tartar sauce. Barbecue sauce. Teriyaki sauce. Soy sauce, including reduced-sodium. Steak sauce. Canned and packaged gravies. Fish sauce. Oyster sauce. Cocktail sauce. Horseradish that you find on the shelf. Ketchup. Mustard. Meat flavorings and tenderizers. Bouillon cubes. Hot sauce and Tabasco sauce. Premade or packaged marinades. Premade or packaged taco seasonings. Relishes. Regular salad dressings. Where to find more information:  National Heart, Lung, and Blood Institute: PopSteam.is  American Heart Association: www.heart.org Summary  The DASH eating plan is a healthy eating plan that has been shown to reduce high blood pressure (hypertension). It may also reduce your risk for type 2 diabetes, heart disease, and stroke.  With the DASH eating plan, you should limit salt  (sodium) intake to 2,300 mg a day. If you have hypertension, you may need to reduce your sodium intake to 1,500 mg a day.  When on the DASH eating plan, aim to eat more fresh fruits and vegetables, whole grains, lean proteins, low-fat dairy, and heart-healthy fats.  Work with your health care provider or diet and nutrition specialist (dietitian) to adjust your eating plan to your individual calorie needs. This information is not intended to replace advice given to you by your health care provider. Make sure you discuss any questions you have with your health care provider. Document Released: 08/12/2011 Document Revised: 08/05/2017 Document Reviewed: 08/16/2016 Elsevier Patient Education  2020 [employer].

## 2019-06-30 DIAGNOSIS — L989 Disorder of the skin and subcutaneous tissue, unspecified: Secondary | ICD-10-CM | POA: Insufficient documentation

## 2019-06-30 NOTE — Assessment & Plan Note (Signed)
Seeing ENT today- will hopefully set up a biopsy ASAP to see if it's a lipoma or a liposarcoma. Continue to follow with ENT and neurosurgery. Will start him gabapentin as it seem like this may be a nerve pain. Recheck 2 weeks. Call with any concerns. Continue to monitor.

## 2019-07-03 ENCOUNTER — Telehealth: Payer: Self-pay | Admitting: Family Medicine

## 2019-07-03 NOTE — Telephone Encounter (Signed)
Called by Dr. Charolett Bumpers- he does not feel anything that could be biopsied. He advised follow with neurosurgery. He is not concerned about a lipoma causing his pain.

## 2019-07-11 ENCOUNTER — Other Ambulatory Visit: Payer: Self-pay

## 2019-07-11 MED ORDER — SILDENAFIL CITRATE 100 MG PO TABS: 100.0000 mg | ORAL_TABLET | ORAL | 5 refills | Status: AC | PRN

## 2019-07-11 NOTE — Telephone Encounter (Signed)
[facility] in [facility] faxed a Rx refill request on Sildenafil 100 mg tab

## 2019-08-09 ENCOUNTER — Other Ambulatory Visit: Payer: Self-pay

## 2019-08-09 DIAGNOSIS — Z20822 Contact with and (suspected) exposure to covid-19: Secondary | ICD-10-CM

## 2019-08-11 LAB — NOVEL CORONAVIRUS, NAA: SARS-CoV-2, NAA: NOT DETECTED

## 2019-09-10 MED ORDER — CIPROFLOXACIN IN D5W 400 MG/200ML IV SOLN
400.00 | INTRAVENOUS | Status: DC
Start: 2019-09-10 — End: 2019-09-10

## 2019-09-10 MED ORDER — ONDANSETRON HCL 4 MG/2ML IJ SOLN
4.00 | INTRAMUSCULAR | Status: DC
Start: ? — End: 2019-09-10

## 2019-09-10 MED ORDER — GENERIC EXTERNAL MEDICATION
5.00 | Status: DC
Start: 2019-09-10 — End: 2019-09-10

## 2019-09-14 ENCOUNTER — Ambulatory Visit (INDEPENDENT_AMBULATORY_CARE_PROVIDER_SITE_OTHER): Payer: BC Managed Care – PPO | Admitting: Nurse Practitioner

## 2019-09-14 ENCOUNTER — Encounter: Payer: Self-pay | Admitting: Nurse Practitioner

## 2019-09-14 ENCOUNTER — Other Ambulatory Visit: Payer: Self-pay

## 2019-09-14 DIAGNOSIS — S01311D Laceration without foreign body of right ear, subsequent encounter: Secondary | ICD-10-CM | POA: Diagnosis not present

## 2019-09-14 DIAGNOSIS — S7012XD Contusion of left thigh, subsequent encounter: Secondary | ICD-10-CM

## 2019-09-14 DIAGNOSIS — S71111D Laceration without foreign body, right thigh, subsequent encounter: Secondary | ICD-10-CM

## 2019-09-14 DIAGNOSIS — S71111A Laceration without foreign body, right thigh, initial encounter: Secondary | ICD-10-CM | POA: Insufficient documentation

## 2019-09-14 DIAGNOSIS — S7012XA Contusion of left thigh, initial encounter: Secondary | ICD-10-CM | POA: Insufficient documentation

## 2019-09-14 MED ORDER — OXYCODONE HCL 5 MG PO TABS: 5.0000 mg | ORAL_TABLET | ORAL | 0 refills | Status: AC | PRN

## 2019-09-14 NOTE — Assessment & Plan Note (Signed)
Minimally painful to patient and appears to be healing well based on photographs sent to mychart. No s/s infection. Will assess in office early next week.

## 2019-09-14 NOTE — Progress Notes (Addendum)
.   There were no vitals taken for this visit.   Subjective:    Patient ID: Kristopher Baird, male    DOB: [birth year], 59 y.o.   MRN: [id number]  HPI: Kristopher Baird is a 59 y.o. male  Chief Complaint  Patient presents with  . Leg Injury    Larey Seat off a ladder 20 ft, pain in thigh, had a minor surgery. cant walk or drive.   He was discharged from the ED with instructions to follow up with maxofacial surgery to remove the ear stitches but the instructions also said pcp could remove stitches in 14 days. Also had stitches to right groin. Will need an in-person appointment for this. The patient is concerned today about the significant pain and swelling that he has continued to have.   WOUND - patient has repaired laceration to right ear, repaired laceration to right thigh, and bruising to face and left inner thigh down to knee. Duration: days Location: multiple; ear (healing well), right thigh (with lots of swelling and pain) Pain:  yes Quality:  stabbing Severity: 5/10 with medicine; 10/10; worse in the evening and first thing in the morning Redness:  yes Swelling:  yes Warmth:  yes, right inner thigh and left inner thigh are about equal in warmth Crinkly: no; but hard and painful and has knots/stuff Oozing:  no Pus:  no Treatments attempted:antibiotics, pain medication, ice, ran out of narcotic medication Past similar infections:  no Past MRSA skin infections:  no History of trauma in area:  yes Fevers:  no  Chills: no Nausea/vomiting:  yes ; X1 after ED and thinks due to all meds given in ED; none since Diarrhea: no Constipation: no Eating/drinking: no Urinating: no    Relevant past medical, surgical, family and social history reviewed and updated as indicated. Interim medical history since our last visit reviewed. Allergies and medications reviewed and updated.  Review of Systems  Constitutional: Positive for activity change. Negative for appetite change, chills,  diaphoresis, fatigue and fever.  HENT: Positive for facial swelling (eye). Negative for congestion, sinus pressure, sinus pain, sneezing and sore throat.   Eyes: Negative for pain, discharge and itching.       Bruising around right eye  Cardiovascular: Positive for leg swelling (upper thigh hematoma).  Gastrointestinal: Negative.  Negative for constipation, diarrhea, nausea and vomiting.  Genitourinary: Negative.  Negative for difficulty urinating, dysuria, flank pain, frequency and hematuria.  Musculoskeletal: Positive for myalgias.  Skin: Positive for wound (right thigh laceration, right pinna laceration).       Bruising to right and left thighs, and eye; laceration to right pinna and thigh both closed with sutures  Neurological: Negative for dizziness, weakness, numbness and headaches.    Per HPI unless specifically indicated above     Objective:    There were no vitals taken for this visit.  Wt Readings from Last 3 Encounters:  06/28/19 221 lb (100.2 kg)  03/13/19 219 lb (99.3 kg)  11/14/18 215 lb 6.4 oz (97.7 kg)    Physical Exam Skin:    Findings: Ecchymosis (to right orbital area, left inner thigh from groin to knee, right inner thigh from groin to knee) and laceration (right ear - closed with stitches and healing well; right inner thigh - closed with stitches and appears to have large, firm, non-crinkly hematoma under skin, no oozing or pus noted per patient) present.     Complete physical exam was unable to be performed today due to lack of equipment.  Photographs that were sent in via mychart were reviewed and are described in the physical examination above.  Results for orders placed or performed in visit on 08/09/19  Novel Coronavirus, NAA ([facility])   Specimen: Nasopharyngeal(NP) swabs in vial transport medium   NASOPHARYNGE  TESTING  Result Value Ref Range   SARS-CoV-2, NAA Not Detected Not Detected      Assessment & Plan:   Problem List Items Addressed This  Visit      Nervous and Auditory   Laceration of ear without complication, right, subsequent encounter    Minimally painful to patient and appears to be healing well based on photographs sent to mychart. No s/s infection. Will assess in office early next week.        Musculoskeletal and Integument   Laceration of right thigh - Primary    Acute, ongoing. With severe pain despite ice and Tylenol. Patient unable to perform some ADLs due to pain, barely able to ambulate. Will refill Oxy Hcl and increase dose to provide more relief. PDMP reviewed and 5 days of medication sent to pharmacy. At his point, does not sound infected based on telephone call today. Will see in office early next week. Continue ice with 15 minutes on, 15 minutes off. Continue Tylenol. Unable to take anti-inflammatories due to kidney function. Encouraged increasing ambulation with better pain relief to prevent DVT. Discussed s/s of infection and go to ED with fever, oozing, or sudden increase in pain at site.      Relevant Medications   oxyCODONE (ROXICODONE) 5 MG immediate release tablet     Other   Hematoma of left thigh    Acute, appears stable. With severe pain despite ice and Tylenol. Patient unable to perform some ADLs due to pain, barely able to ambulate. Will refill Oxy Hcl and increase dose to provide more relief. PDMP reviewed and 5 days of medication sent to pharmacy. Will see in office early next week. Continue ice with 15 minutes on, 15 minutes off. Continue Tylenol. Unable to take anti-inflammatories due to kidney function. Encouraged increasing ambulation with better pain relief to prevent DVT.           Follow up plan: Return in about 3 days (around 09/17/2019) for stitches check and wound follow up.  This visit was completed via telephone due to the restrictions of the COVID-19 pandemic. All issues as above were discussed and addressed but no physical exam was performed. If it was felt that the patient  should be evaluated in the office, they were directed there. The patient verbally consented to this visit. Patient was unable to complete an audio/visual visit due to Lack of equipment. . Location of the patient: home . Location of the provider: home . Those involved with this call:  . Provider: Carnella Guadalajara, DNP . CMA: Lauretta Grill, CMA . Front Desk/Registration: Don Perking  . Time spent on call: 24 minutes on the phone discussing health concerns. 30 minutes total spent in review of patient's record and preparation of their chart.

## 2019-09-14 NOTE — Assessment & Plan Note (Signed)
Acute, ongoing. With severe pain despite ice and Tylenol. Patient unable to perform some ADLs due to pain, barely able to ambulate. Will refill Oxy Hcl and increase dose to provide more relief. PDMP reviewed and 5 days of medication sent to pharmacy. At his point, does not sound infected based on telephone call today. Will see in office early next week. Continue ice with 15 minutes on, 15 minutes off. Continue Tylenol. Unable to take anti-inflammatories due to kidney function. Encouraged increasing ambulation with better pain relief to prevent DVT. Discussed s/s of infection and go to ED with fever, oozing, or sudden increase in pain at site.

## 2019-09-14 NOTE — Assessment & Plan Note (Addendum)
Acute, appears stable. With severe pain despite ice and Tylenol. Patient unable to perform some ADLs due to pain, barely able to ambulate. Will refill Oxy Hcl and increase dose to provide more relief. PDMP reviewed and 5 days of medication sent to pharmacy. Will see in office early next week. Continue ice with 15 minutes on, 15 minutes off. Continue Tylenol. Unable to take anti-inflammatories due to kidney function. Encouraged increasing ambulation with better pain relief to prevent DVT.

## 2019-09-14 NOTE — Patient Instructions (Signed)
Sutured Wound Care Sutures are stitches that can be used to close wounds. Taking care of your wound properly can help to prevent pain and infection. It can also help your wound to heal more quickly. Follow instructions from your health care provider about how to care for your sutured wound. Supplies needed:  Soap and water.  A clean bandage (dressing), if needed.  Antibiotic ointment.  A clean towel. How to care for your sutured wound   Keep the wound completely dry for the first 24 hours, or for as long as directed by your health care provider. After 24-48 hours, you may shower or bathe as directed by your health care provider. Do not soak or submerge the wound in water until the sutures have been removed.  After the first 24 hours, clean the wound once a day, or as often as directed by your health care provider, using the following steps: ? Wash the wound with soap and water. ? Rinse the wound with water to remove all soap. ? Pat the wound dry with a clean towel. Do not rub the wound.  After cleaning the wound, apply a thin layer of antibiotic ointment as directed by your health care provider. This will prevent infection and keep the dressing from sticking to the wound.  Follow instructions from your health care provider about how to change your dressing: ? Wash your hands with soap and water. If soap and water are not available, use hand sanitizer. ? Change your dressing at least once a day, or as often as told by your health care provider. If your dressing gets wet or dirty, change it. ? Leave sutures and other skin closures, such as adhesive tape or skin glue, in place. These skin closures may need to stay in place for 2 weeks or longer. If adhesive strip edges start to loosen and curl up, you may trim the loose edges. Do not remove adhesive strips completely unless your health care provider tells you to do that.  Check your wound every day for signs of infection. Watch  for: ? Redness, swelling, or pain. ? Fluid or blood. ? Warmth. ? Pus or a bad smell.  Have the sutures removed as directed by your health care provider. Follow these instructions at home: Medicines  Take or apply over-the-counter and prescription medicines only as told by your health care provider.  If you were prescribed an antibiotic medicine or ointment, take or apply it as told by your health care provider. Do not stop using the antibiotic even if your condition improves. General instructions  To help reduce scarring after your wound heals, cover your wound with clothing or apply sunscreen of at least 30 SPF whenever you are outside.  Do not scratch or pick at your wound.  Avoid stretching your wound.  Raise (elevate) the injured area above the level of your heart while you are sitting or lying down, if possible.  Drink enough fluids to keep your urine clear or pale yellow.  Keep all follow-up visits as told by your health care provider. This is important. Contact a health care provider if:  You received a tetanus shot and you have swelling, severe pain, redness, or bleeding at the injection site.  Your wound breaks open.  You have redness, swelling, or pain around your wound.  You have fluid or blood coming from your wound.  Your wound feels warm to the touch.  You have a fever.  You notice something coming out of your   wound, such as wood or glass.  You have pain that does not get better with medicine.  The skin near your wound changes color.  You need to change your dressing very frequently due to a lot of fluid, blood, or pus draining from the wound.  You develop a new rash.  You develop numbness around the wound. Get help right away if:  You develop severe swelling around your wound.  You have pus or a bad smell coming from your wound.  Your pain suddenly gets worse and is severe.  You develop painful lumps near your wound or anywhere on your  body.  You have a red streak going away from your wound.  The wound is on your hand or foot and: ? You cannot properly move a finger or toe. ? Your fingers or toes look pale or bluish. ? You have numbness that is spreading down your hand, foot, fingers, or toes. Summary  Sutures are stitches that can be used to close wounds.  Taking care of your wound properly can help to prevent pain and infection.  Keep the wound completely dry for the first 24 hours, or for as long as directed by your health care provider. After 24-48 hours, you may shower or bathe as directed by your health care provider. This information is not intended to replace advice given to you by your health care provider. Make sure you discuss any questions you have with your health care provider. Document Revised: 08/05/2017 Document Reviewed: 09/28/2016 Elsevier Patient Education  2020 [employer]  

## 2019-09-16 NOTE — Progress Notes (Signed)
BP 131/82   Pulse 89   Temp 98.4 F (36.9 C) (Oral)   SpO2 99%    Subjective:    Patient ID: Kristopher Baird, male    DOB: 13-Apr-1961, 59 y.o.   MRN: [id number]  HPI: Kristopher Baird is a 59 y.o. male  Chief Complaint  Patient presents with  . Wound Check   Wound Duration: weeks - 1  Location: Right thigh foreign body/subq emphysema repair  Left thigh - bruise  Face - bruise  Right ear - laceration repair Pain:  yes Quality:  aching Severity: 5/10 - 10/10 Redness:  yes Swelling:  yes Warmth:  yes Oozing:  no Pus:  no Treatments attempted:ice, APAP & 1 oxycodone q4 Past similar infections:  no Past MRSA skin infections:  no  History of trauma in area:  yes Fevers:  no Nausea/vomiting:  no  Diarrhea: no  Relevant past medical, surgical, family and social history reviewed and updated as indicated. Interim medical history since our last visit reviewed. Allergies and medications reviewed and updated.  Review of Systems  Constitutional: Positive for activity change (due to pain, but stable). Negative for chills and fever.  Respiratory: Negative.  Negative for chest tightness and shortness of breath.   Cardiovascular: Negative.  Negative for chest pain.  Gastrointestinal: Negative.  Negative for abdominal pain, diarrhea, nausea and vomiting.  Musculoskeletal: Positive for back pain.  Skin: Positive for wound. Negative for color change, pallor and rash.  Neurological: Negative.  Negative for weakness and headaches.  Hematological: Negative.  Does not bruise/bleed easily.  Psychiatric/Behavioral: Positive for sleep disturbance (due to pain). Negative for confusion. The patient is not nervous/anxious.     Per HPI unless specifically indicated above     Objective:    BP 131/82   Pulse 89   Temp 98.4 F (36.9 C) (Oral)   SpO2 99%   Wt Readings from Last 3 Encounters:  06/28/19 221 lb (100.2 kg)  03/13/19 219 lb (99.3 kg)  11/14/18 215 lb 6.4 oz (97.7 kg)      Physical Exam Vitals and nursing note reviewed.  Constitutional:      General: He is not in acute distress.    Appearance: He is normal weight. He is not toxic-appearing.  HENT:     Head: Normocephalic.     Left Ear: External ear normal.     Ears:     Comments: Right pinna with stitches    Mouth/Throat:     Mouth: Mucous membranes are moist.     Pharynx: Oropharynx is clear.  Eyes:     General: No scleral icterus.    Extraocular Movements: Extraocular movements intact.  Pulmonary:     Effort: Pulmonary effort is normal. No respiratory distress.  Musculoskeletal:        General: Swelling, tenderness and signs of injury present.     Right upper leg: Swelling, laceration (closed with stitches; large erythematous area surrounding sutures; no crepitus around sutures, no oozing or pus; redness/swelling/ecchymosis looks improved from photographs sent in via mychart) and tenderness present.     Left upper leg: Swelling (ecchymosis over larger area of skin and appears to be improving; different stages of ecchymosis on extremity) and tenderness present.     Right lower leg: No edema.     Left lower leg: No edema.     Comments: Stiff ROM to lower extremities  Skin:    Coloration: Skin is not ashen, cyanotic, jaundiced or pale.     Findings:  Bruising (Facial - much improved; left thigh - seems to be spreading but appears to be improving; right thigh - stable) and erythema present.  Neurological:     General: No focal deficit present.     Mental Status: He is alert and oriented to person, place, and time.     Motor: No weakness.  Psychiatric:        Mood and Affect: Mood normal.        Behavior: Behavior normal.        Thought Content: Thought content normal.        Judgment: Judgment normal.     Results for orders placed or performed in visit on 08/09/19  Novel Coronavirus, NAA ([facility])   Specimen: Nasopharyngeal(NP) swabs in vial transport medium   NASOPHARYNGE  TESTING  Result  Value Ref Range   SARS-CoV-2, NAA Not Detected Not Detected      Assessment & Plan:   Problem List Items Addressed This Visit      Musculoskeletal and Integument   Laceration of right thigh - Primary    Acute, ongoing but stable from visualization of previous photographs via mychart. Pain has been staying stable while taking Tylenol and Oxy. No oozing or crepitus under skin on examination. Continue ice 15 minutes on and 15 minutes off and Tylenol/Oxy. Will obtain imaging and consider referral to surgery if pain worsens, swelling increases, or fever starts. Discussed s/s and when to go to ED.          Follow up plan: Return in about 1 week (around 09/24/2019) for wound check, possible stitches removal.

## 2019-09-17 ENCOUNTER — Ambulatory Visit: Payer: BC Managed Care – PPO | Admitting: Nurse Practitioner

## 2019-09-17 ENCOUNTER — Encounter: Payer: Self-pay | Admitting: Nurse Practitioner

## 2019-09-17 ENCOUNTER — Other Ambulatory Visit: Payer: Self-pay

## 2019-09-17 VITALS — BP 131/82 | HR 89 | Temp 98.4°F

## 2019-09-17 DIAGNOSIS — S71111D Laceration without foreign body, right thigh, subsequent encounter: Secondary | ICD-10-CM

## 2019-09-17 NOTE — Assessment & Plan Note (Addendum)
Acute, ongoing but stable from visualization of previous photographs via mychart. Pain has been staying stable while taking Tylenol and Oxy. No oozing or crepitus under skin on examination. Continue ice 15 minutes on and 15 minutes off and Tylenol/Oxy. Will obtain imaging and consider referral to surgery if pain worsens, swelling increases, or fever starts. Discussed s/s and when to go to ED.

## 2019-09-18 ENCOUNTER — Other Ambulatory Visit: Payer: Self-pay

## 2019-09-18 ENCOUNTER — Ambulatory Visit: Payer: BC Managed Care – PPO | Admitting: Nurse Practitioner

## 2019-09-18 ENCOUNTER — Encounter: Payer: Self-pay | Admitting: Nurse Practitioner

## 2019-09-18 VITALS — BP 139/74 | HR 70 | Temp 98.4°F

## 2019-09-18 DIAGNOSIS — S71111D Laceration without foreign body, right thigh, subsequent encounter: Secondary | ICD-10-CM

## 2019-09-18 NOTE — Assessment & Plan Note (Addendum)
Upon examination, appears 2-3 stitches have burst on right upper thigh laceration. Laceration remains completed closed with no oozing or discharge on examination. Some scabbing present. Redness surrounding laceration appears stable. Wound cleansed with betadine, prepped with benzoin tincture, and steri-strips applied over entire length of laceration. Patient instructed to notify office with s/s including increase in pain and oozing of pus/thick white/green purulent discharge from laceration. If pain persistent and/or oozing purulent discharge, may need further imaging and/or general surgery referral.

## 2019-09-18 NOTE — Progress Notes (Signed)
BP 139/74   Pulse 70   Temp 98.4 F (36.9 C) (Oral)   SpO2 99%    Subjective:    Patient ID: Kristopher Baird, male    DOB: 1961/06/07, 59 y.o.   MRN: [id number]  HPI: Kristopher Baird is a 59 y.o. male  Chief Complaint  Patient presents with  . Wound Check    Last night when he went to bed, his wound was burning. He went to the bathroom and saw a small amount of blood on the commode and noticed the wound was oozing clear/bloody fluid. He thinks a couple of stitches may have popped overnight or while putting on/taking off pants. Otherwise no major complaints.   Relevant past medical, surgical, family and social history reviewed and updated as indicated. Interim medical history since our last visit reviewed. Allergies and medications reviewed and updated.  Review of Systems  Constitutional: Negative.  Negative for fever.  Cardiovascular: Negative.  Negative for leg swelling.  Gastrointestinal: Negative.   Skin: Positive for wound. Negative for color change, pallor and rash.  Neurological: Negative for dizziness, weakness and numbness.  Psychiatric/Behavioral: Negative for confusion and sleep disturbance. The patient is not nervous/anxious.      Per HPI unless specifically indicated above     Objective:    BP 139/74   Pulse 70   Temp 98.4 F (36.9 C) (Oral)   SpO2 99%   Wt Readings from Last 3 Encounters:  06/28/19 221 lb (100.2 kg)  03/13/19 219 lb (99.3 kg)  11/14/18 215 lb 6.4 oz (97.7 kg)    Physical Exam Vitals and nursing note reviewed.  Constitutional:      General: He is not in acute distress.    Appearance: Normal appearance. He is normal weight. He is not ill-appearing, toxic-appearing or diaphoretic.  HENT:     Head: Normocephalic and atraumatic.     Right Ear: External ear normal.     Left Ear: External ear normal.     Ears:     Comments: Right pinna with stitches Eyes:     General: No scleral icterus.    Extraocular Movements: Extraocular  movements intact.  Pulmonary:     Effort: Pulmonary effort is normal. No respiratory distress.  Musculoskeletal:        General: Tenderness and signs of injury present. No swelling.     Cervical back: Normal range of motion. No rigidity.     Right upper leg: Swelling, laceration (closed with stitches; no crepitus around sutures, no oozing or pus; redness/swelling/ecchymosis looks stable) and tenderness present.     Left upper leg: Swelling (ecchymosis over larger area of skin and appears to be improving; different stages of ecchymosis on extremity) and tenderness present.     Right lower leg: No edema.     Left lower leg: No edema.  Skin:    Coloration: Skin is not ashen, cyanotic, jaundiced or pale.     Findings: Bruising (left thigh - stable; right thigh - stable) and erythema (surrounding sutures on right thigh) present.  Neurological:     General: No focal deficit present.     Mental Status: He is alert and oriented to person, place, and time.     Motor: No weakness.     Gait: Gait abnormal (due to wound).  Psychiatric:        Mood and Affect: Mood normal.        Behavior: Behavior normal.        Thought Content: Thought  content normal.        Judgment: Judgment normal.     Results for orders placed or performed in visit on 08/09/19  Novel Coronavirus, NAA ([facility])   Specimen: Nasopharyngeal(NP) swabs in vial transport medium   NASOPHARYNGE  TESTING  Result Value Ref Range   SARS-CoV-2, NAA Not Detected Not Detected      Assessment & Plan:   Problem List Items Addressed This Visit      Musculoskeletal and Integument   Laceration of right thigh - Primary    Upon examination, appears 2-3 stitches have burst on right upper thigh laceration. Laceration remains completed closed with no oozing or discharge on examination. Some scabbing present. Redness surrounding laceration appears stable. Wound cleansed with betadine, prepped with benzoin tincture, and steri-strips applied  over entire length of laceration. Patient instructed to notify office with s/s including increase in pain and oozing of pus/thick white/green purulent discharge from laceration. If pain persistent and/or oozing purulent discharge, may need further imaging and/or general surgery referral.           Follow up plan: Return if symptoms worsen or fail to improve.

## 2019-09-23 ENCOUNTER — Encounter: Payer: Self-pay | Admitting: Nurse Practitioner

## 2019-09-24 ENCOUNTER — Encounter: Payer: Self-pay | Admitting: Nurse Practitioner

## 2019-09-24 ENCOUNTER — Other Ambulatory Visit: Payer: Self-pay

## 2019-09-24 ENCOUNTER — Ambulatory Visit: Payer: BC Managed Care – PPO | Admitting: Nurse Practitioner

## 2019-09-24 VITALS — BP 131/78 | HR 92 | Temp 97.8°F | Ht 71.0 in | Wt 220.0 lb

## 2019-09-24 DIAGNOSIS — S01311D Laceration without foreign body of right ear, subsequent encounter: Secondary | ICD-10-CM | POA: Diagnosis not present

## 2019-09-24 DIAGNOSIS — S7012XD Contusion of left thigh, subsequent encounter: Secondary | ICD-10-CM | POA: Diagnosis not present

## 2019-09-24 DIAGNOSIS — S71111D Laceration without foreign body, right thigh, subsequent encounter: Secondary | ICD-10-CM | POA: Diagnosis not present

## 2019-09-24 NOTE — Assessment & Plan Note (Addendum)
Laceration well-approximated with good signs of healing. 7 stitches removed from pinna today. Patient tolerated well, no bleeding or oozing. Discussed s/s of infection and to return to clinic with any wound complications.

## 2019-09-24 NOTE — Assessment & Plan Note (Signed)
Hematoma coloring much improved. Encouraged ice twice per day to hematoma under skin. Pain much improved but can continue Tylenol as needed for pain.

## 2019-09-24 NOTE — Assessment & Plan Note (Addendum)
Laceration well-approximated with good signs of healing. Given some swelling and redness around the stitches, will leave in place for now. Steri strips placed over the incision. Likely will remove stitches at follow up later this week.

## 2019-09-24 NOTE — Progress Notes (Signed)
BP 131/78 (BP Location: Left Arm, Patient Position: Sitting, Cuff Size: Normal)   Pulse 92   Temp 97.8 F (36.6 C) (Oral)   Ht 5\' 11"  (1.803 m)   Wt 220 lb (99.8 kg)   SpO2 98%   BMI 30.68 kg/m    Subjective:    Patient ID: Kristopher Baird, male    DOB: 1961-08-19, 59 y.o.   MRN: [id number]  HPI: Kristopher Baird is a 59 y.o. male  Chief Complaint  Patient presents with  . Wound Check   WOUND CHECK Duration: weeks Location: right inner thigh, ear Pain:  yes, but very much improved Quality:  burning Severity: mild Redness:  yes but almost gone Swelling:  yes, but improved Warmth:  no Oozing:  no Pus:  no Treatments attempted:Ice Past similar infections:  no Past MRSA skin infections:  no History of trauma in area:  yes; fell from ladder 09/09/2019 Fevers:  no Nausea/vomiting:  no  Today, Kristopher Baird is worried about the "hard lumps" under his skin. He says they are not getting much better or worse.   Relevant past medical, surgical, family and social history reviewed and updated as indicated. Interim medical history since our last visit reviewed. Allergies and medications reviewed and updated.  Review of Systems  Constitutional: Negative.  Negative for activity change and fever.  HENT: Negative.  Negative for ear discharge and ear pain.   Cardiovascular: Negative.  Negative for leg swelling.  Gastrointestinal: Negative.  Negative for diarrhea, nausea and vomiting.  Skin: Positive for wound. Negative for color change, pallor and rash.  Neurological: Negative for dizziness, weakness and numbness.  Psychiatric/Behavioral: Negative for confusion and sleep disturbance. The patient is not nervous/anxious.     Per HPI unless specifically indicated above     Objective:    BP 131/78 (BP Location: Left Arm, Patient Position: Sitting, Cuff Size: Normal)   Pulse 92   Temp 97.8 F (36.6 C) (Oral)   Ht 5\' 11"  (1.803 m)   Wt 220 lb (99.8 kg)   SpO2 98%   BMI 30.68 kg/m    Wt Readings from Last 3 Encounters:  09/24/19 220 lb (99.8 kg)  06/28/19 221 lb (100.2 kg)  03/13/19 219 lb (99.3 kg)    Physical Exam Vitals and nursing note reviewed.  Constitutional:      General: He is not in acute distress.    Appearance: Normal appearance. He is normal weight. He is not ill-appearing, toxic-appearing or diaphoretic.  HENT:     Head: Normocephalic and atraumatic.     Right Ear: External ear normal.     Left Ear: External ear normal.     Ears:     Comments: Right pinna with stitches Eyes:     General: No scleral icterus.    Extraocular Movements: Extraocular movements intact.  Pulmonary:     Effort: Pulmonary effort is normal. No respiratory distress.  Musculoskeletal:        General: Tenderness (to right thigh) present. No swelling.     Cervical back: Normal range of motion. No rigidity.     Right upper leg: Swelling, laceration (closed with stitches; no crepitus around sutures, no oozing or pus; redness/swelling/ecchymosis looks stable) and tenderness present.     Left upper leg: Swelling (hard hematoma palpated under skin, no skin discoloration) and tenderness present.     Right lower leg: No edema.     Left lower leg: No edema.  Skin:    Coloration: Skin is not ashen,  cyanotic, jaundiced or pale.     Findings: Bruising (left thigh - much improved, almost resolved, long hematoma appreciated under skin; right thigh - much improved, almost resolved) and erythema (surrounding sutures on right thigh) present.  Neurological:     General: No focal deficit present.     Mental Status: He is alert and oriented to person, place, and time.     Motor: No weakness.     Gait: Gait normal.  Psychiatric:        Mood and Affect: Mood normal.        Behavior: Behavior normal.        Thought Content: Thought content normal.        Judgment: Judgment normal.       Assessment & Plan:   Problem List Items Addressed This Visit      Nervous and Auditory   Laceration  of ear without complication, right, subsequent encounter - Primary    Laceration well-approximated with good signs of healing. 7 stitches removed from pinna today. Patient tolerated well, no bleeding or oozing. Discussed s/s of infection and to return to clinic with any wound complications.        Musculoskeletal and Integument   Laceration of right thigh    Laceration well-approximated with good signs of healing. Given some swelling and redness around the stitches, will leave in place for now. Steri strips placed over the incision. Likely will remove stitches at follow up later this week.        Other   Hematoma of left thigh    Hematoma coloring much improved. Encouraged ice twice per day to hematoma under skin. Pain much improved but can continue Tylenol as needed for pain.           Follow up plan: Return in about 3 days (around 09/27/2019), or if symptoms worsen or fail to improve, for wound check.

## 2019-09-28 ENCOUNTER — Ambulatory Visit: Payer: BC Managed Care – PPO | Admitting: Nurse Practitioner

## 2019-09-28 ENCOUNTER — Encounter: Payer: Self-pay | Admitting: Nurse Practitioner

## 2019-09-28 ENCOUNTER — Other Ambulatory Visit: Payer: Self-pay

## 2019-09-28 VITALS — BP 114/70 | HR 96 | Temp 97.8°F | Ht 71.0 in | Wt 220.0 lb

## 2019-09-28 DIAGNOSIS — T8141XA Infection following a procedure, superficial incisional surgical site, initial encounter: Secondary | ICD-10-CM | POA: Diagnosis not present

## 2019-09-28 DIAGNOSIS — S71111D Laceration without foreign body, right thigh, subsequent encounter: Secondary | ICD-10-CM

## 2019-09-28 DIAGNOSIS — M62830 Muscle spasm of back: Secondary | ICD-10-CM | POA: Diagnosis not present

## 2019-09-28 MED ORDER — CYCLOBENZAPRINE HCL 10 MG PO TABS: 10.0000 mg | ORAL_TABLET | Freq: Three times a day (TID) | ORAL | 0 refills | Status: DC | PRN

## 2019-09-28 MED ORDER — SULFAMETHOXAZOLE-TRIMETHOPRIM 400-80 MG PO TABS: 2.0000 | ORAL_TABLET | Freq: Two times a day (BID) | ORAL | 0 refills | Status: DC

## 2019-09-28 NOTE — Assessment & Plan Note (Addendum)
Five sutures to right thigh removed.  Incision cleansed with alcohol and steri-strips applied over incision.  Instructed that steri-strips will fall off on their own.  Paperwork for work filled out.  Call back or return to clinic with any issues with healed incision.

## 2019-09-28 NOTE — Progress Notes (Signed)
BP 114/70 (BP Location: Left Arm, Patient Position: Sitting, Cuff Size: Normal)   Pulse 96   Temp 97.8 F (36.6 C) (Oral)   Ht 5\' 11"  (1.803 m)   Wt 220 lb (99.8 kg)   SpO2 96%   BMI 30.68 kg/m    Subjective:    Patient ID: Kristopher Baird, male    DOB: 25-Nov-1960, 59 y.o.   MRN: [id number]  HPI: Kristopher Baird is a 59 y.o. male  Chief Complaint  Patient presents with  . Wound Check    Patient states that he has some infection/abcess where his incision is on his leg.  . Pain    Pain has increased. Been taking tylenol and took 1 oxycodone left over for pain last night.    ABSCESS Duration: weeks Location:  Pain:  yes, got worse last night Quality:  sharp Severity: moderate Redness:  yes Swelling:  yes Warmth:  yes Oozing:  no Pus:  no Treatments attempted:Corectrex Essential Oil Past similar infections:  no Past MRSA skin infections:  no History of trauma in area:  yes Fevers:  no Nausea/vomiting:  no   On 09/24/2019, stitches were removed from ear and steri-strips were placed over thigh laceration site, stitches were left intact.  Today, he thinks that a small abscess has developed around the site of one of the sutures.  Allergies  Allergen Reactions  . Penicillins     Doesn't remember  . Prevacid [Lansoprazole] Swelling   Outpatient Encounter Medications as of 09/28/2019  Medication Sig  . sildenafil (VIAGRA) 100 MG tablet Take 1 tablet (100 mg total) by mouth as needed for erectile dysfunction.  . cyclobenzaprine (FLEXERIL) 10 MG tablet Take 1 tablet (10 mg total) by mouth 3 (three) times daily as needed for muscle spasms.  09/30/2019 sulfamethoxazole-trimethoprim (BACTRIM) 400-80 MG tablet Take 2 tablets by mouth 2 (two) times daily.   No facility-administered encounter medications on file as of 09/28/2019.   Patient Active Problem List   Diagnosis Date Noted  . Abscess involving suture 09/28/2019  . Muscle spasm of back 09/28/2019  . Laceration of right  thigh 09/14/2019  . Hematoma of left thigh 09/14/2019  . Lesion of neck 06/30/2019  . 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 Neck pain on left side 11/14/2018  . Trochanteric bursitis 07/05/2018  . Screen for colon cancer   . Hip pain, chronic, left 10/25/2017  . Plantar fasciitis, bilateral 12/22/2016  . Morton's neuroma of right foot 12/03/2015  . Knee pain, right 06/11/2015  . Hyperlipidemia    Past Medical History:  Diagnosis Date  . Depression   . Hyperlipidemia   . Insomnia   . Sleep apnea    Relevant past medical, surgical, family and social history reviewed and updated as indicated. Interim medical history since our last visit reviewed.  Review of Systems  Constitutional: Negative.  Negative for activity change, chills, fatigue and fever.  Gastrointestinal: Negative.  Negative for diarrhea, nausea and vomiting.  Skin: Positive for wound (reports abscess to one of the sutures).  Neurological: Negative for dizziness.  Psychiatric/Behavioral: Negative.  Negative for confusion and sleep disturbance. The patient is not nervous/anxious.     Per HPI unless specifically indicated above     Objective:    BP 114/70 (BP Location: Left Arm, Patient Position: Sitting, Cuff Size: Normal)   Pulse 96   Temp 97.8 F (36.6 C) (Oral)   Ht 5\' 11"  (1.803 m)   Wt 220 lb (  99.8 kg)   SpO2 96%   BMI 30.68 kg/m   Wt Readings from Last 3 Encounters:  09/28/19 220 lb (99.8 kg)  09/24/19 220 lb (99.8 kg)  06/28/19 221 lb (100.2 kg)    Physical Exam Vitals and nursing note reviewed.  Constitutional:      General: He is not in acute distress.    Appearance: Normal appearance. He is normal weight. He is not ill-appearing, toxic-appearing or diaphoretic.  HENT:     Head: Normocephalic and atraumatic.     Right Ear: External ear normal.     Left Ear: External ear normal.     Ears:     Comments: Right pinna incision healing well Eyes:      General: No scleral icterus.    Extraocular Movements: Extraocular movements intact.  Pulmonary:     Effort: Pulmonary effort is normal. No respiratory distress.  Musculoskeletal:        General: Tenderness (to right thigh) present. No swelling.     Cervical back: Normal range of motion. No rigidity.     Right upper leg: Swelling, laceration (closed with stitches) and tenderness present.     Left upper leg: Swelling (hard hematoma palpated under skin on posterior upper leg and near left medial knee, no skin discoloration) and tenderness present.     Right lower leg: No edema.     Left lower leg: No edema.  Skin:    Coloration: Skin is not ashen, cyanotic, jaundiced or pale.     Findings: Erythema (surrounding sutures on right thigh; redness around top and bottom of wound seems slightly worse than previous, no crepitus around sutures) and wound (incision with some scabbing and crusting with scar formation) present.  Neurological:     General: No focal deficit present.     Mental Status: He is alert and oriented to person, place, and time.     Motor: No weakness.     Gait: Gait normal.  Psychiatric:        Mood and Affect: Mood normal.        Behavior: Behavior normal.        Thought Content: Thought content normal.        Judgment: Judgment normal.       Assessment & Plan:   Problem List Items Addressed This Visit      Musculoskeletal and Integument   Laceration of right thigh    Five sutures to right thigh removed.  Incision cleansed with alcohol and steri-strips applied over incision.  Instructed that steri-strips will fall off on their own.  Paperwork for work filled out.  Call back or return to clinic with any issues with healed incision.         Other   Abscess involving suture - Primary    Given increase in redness and pain, likely small abscess developing.  Rx for Bactrim bid for ten days sent to pharmacy.  Patient to call back or return to clinic with any concerns or  worsening of redness at incision site.      Relevant Medications   sulfamethoxazole-trimethoprim (BACTRIM) 400-80 MG tablet   Muscle spasm of back    Patient with history of muscle spasms in past and increase in frequency and severity of muscle spasms since fall at the beginning of January.  Will send prescription for Flexeril in as this has worked well for the patient in the past.      Relevant Medications   cyclobenzaprine (FLEXERIL) 10 MG  tablet       Follow up plan: Return if symptoms worsen or fail to improve.

## 2019-09-28 NOTE — Assessment & Plan Note (Signed)
Patient with history of muscle spasms in past and increase in frequency and severity of muscle spasms since fall at the beginning of January.  Will send prescription for Flexeril in as this has worked well for the patient in the past.

## 2019-09-28 NOTE — Assessment & Plan Note (Addendum)
Given increase in redness and pain, likely small abscess developing.  Rx for Bactrim bid for ten days sent to pharmacy.  Patient to call back or return to clinic with any concerns or worsening of redness at incision site.

## 2019-10-05 ENCOUNTER — Encounter: Payer: Self-pay | Admitting: Nurse Practitioner

## 2019-10-24 ENCOUNTER — Other Ambulatory Visit: Payer: Self-pay

## 2019-10-24 DIAGNOSIS — M62830 Muscle spasm of back: Secondary | ICD-10-CM

## 2019-10-24 NOTE — Telephone Encounter (Signed)
Fax from [employer]  Cyclobenzaprine 10 mg tab

## 2019-10-25 MED ORDER — CYCLOBENZAPRINE HCL 10 MG PO TABS: 10.0000 mg | ORAL_TABLET | Freq: Three times a day (TID) | ORAL | 0 refills | Status: DC | PRN

## 2019-12-17 ENCOUNTER — Ambulatory Visit: Payer: BC Managed Care – PPO

## 2019-12-17 ENCOUNTER — Ambulatory Visit (INDEPENDENT_AMBULATORY_CARE_PROVIDER_SITE_OTHER): Payer: Self-pay

## 2019-12-17 ENCOUNTER — Other Ambulatory Visit: Payer: Self-pay

## 2019-12-17 DIAGNOSIS — L719 Rosacea, unspecified: Secondary | ICD-10-CM

## 2019-12-17 DIAGNOSIS — Z872 Personal history of diseases of the skin and subcutaneous tissue: Secondary | ICD-10-CM

## 2019-12-17 DIAGNOSIS — I781 Nevus, non-neoplastic: Secondary | ICD-10-CM

## 2020-01-03 ENCOUNTER — Ambulatory Visit: Payer: BC Managed Care – PPO | Admitting: Dermatology

## 2020-04-14 ENCOUNTER — Other Ambulatory Visit: Payer: Self-pay

## 2020-04-14 NOTE — Telephone Encounter (Signed)
Looks like he established care with [facility] for primary care - this needs to be sent there

## 2020-06-26 ENCOUNTER — Other Ambulatory Visit: Payer: Self-pay | Admitting: Podiatry

## 2020-06-26 DIAGNOSIS — M7989 Other specified soft tissue disorders: Secondary | ICD-10-CM

## 2020-07-13 ENCOUNTER — Ambulatory Visit
Admission: RE | Admit: 2020-07-13 | Discharge: 2020-07-13 | Disposition: A | Discharge status: Home / Self Care | Payer: BC Managed Care – PPO | Source: Ambulatory Visit | Attending: Podiatry | Admitting: Podiatry

## 2020-07-13 ENCOUNTER — Other Ambulatory Visit: Payer: Self-pay

## 2020-07-13 DIAGNOSIS — M7989 Other specified soft tissue disorders: Secondary | ICD-10-CM | POA: Diagnosis not present

## 2020-07-13 MED ORDER — GADOBUTROL 1 MMOL/ML IV SOLN
10.0000 mL | Freq: Once | INTRAVENOUS | Status: AC | PRN
  Administered 2020-07-13: 10 mL via INTRAVENOUS

## 2020-08-11 ENCOUNTER — Ambulatory Visit (INDEPENDENT_AMBULATORY_CARE_PROVIDER_SITE_OTHER): Payer: Self-pay

## 2020-08-11 ENCOUNTER — Other Ambulatory Visit: Payer: Self-pay

## 2020-08-11 DIAGNOSIS — L719 Rosacea, unspecified: Secondary | ICD-10-CM

## 2020-08-11 DIAGNOSIS — I781 Nevus, non-neoplastic: Secondary | ICD-10-CM

## 2020-08-11 DIAGNOSIS — L578 Other skin changes due to chronic exposure to nonionizing radiation: Secondary | ICD-10-CM

## 2020-08-11 NOTE — Progress Notes (Signed)
Tolerated tx. Well. See log sheet for settings. jj

## 2021-04-26 ENCOUNTER — Other Ambulatory Visit: Payer: Self-pay | Admitting: Family Medicine

## 2021-04-26 NOTE — Telephone Encounter (Signed)
Pt with new PCP 05/08/20

## 2021-06-28 IMAGING — MR MR ANKLE*R* WO/W CM
7 of 9 series · 32 of 40 positions shown · IV contrast (gadavist)
Comparison: None.

CLINICAL DATA: The patient reports a mass on the right ankle for 3
months.

EXAM:
MRI OF THE RIGHT ANKLE WITHOUT AND WITH CONTRAST
TECHNIQUE: Multiplanar, multisequence MR imaging of the ankle was performed
before and after the administration of intravenous contrast.
CONTRAST:  10 mL GADAVIST IV SOLN

[Series 3: PD fat-sat · axial · right · 5.0mm · 0.50mm/px · z∈[-69,+164]mm · 6 of 40 slices shown]
[im 1/40]
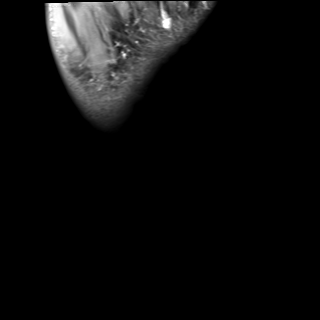
[im 8/40]
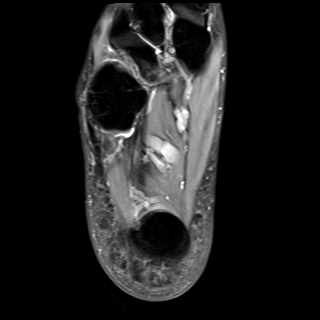
[im 16/40]
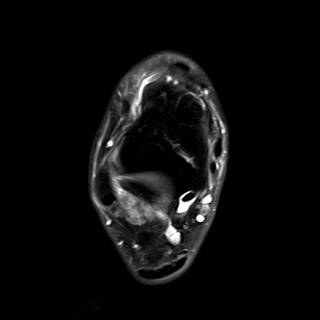
[im 24/40]
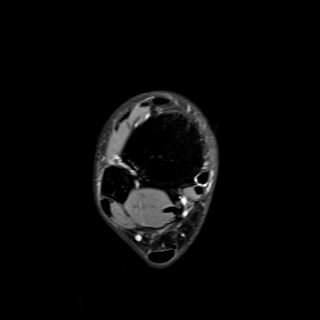
[im 32/40]
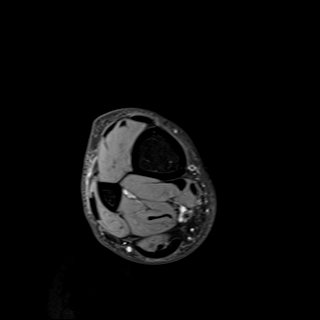
[im 40/40]
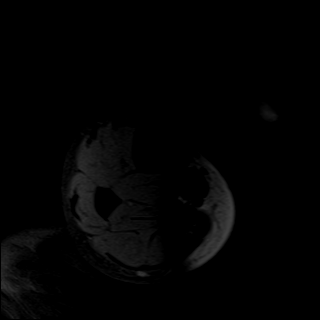

[Series 4: T2 fat-sat · axial · right · 5.0mm · 0.50mm/px · z∈[-69,+164]mm · 5 of 40 slices shown]
[im 1/40]
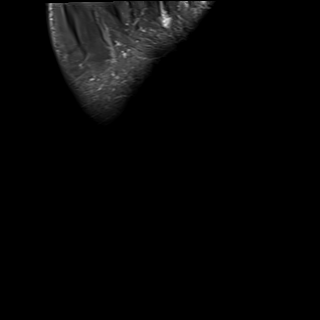
[im 10/40]
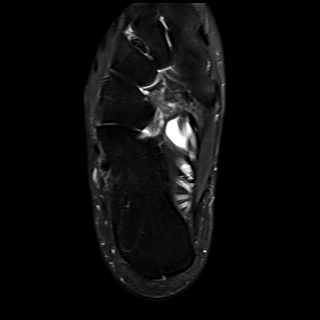
[im 20/40]
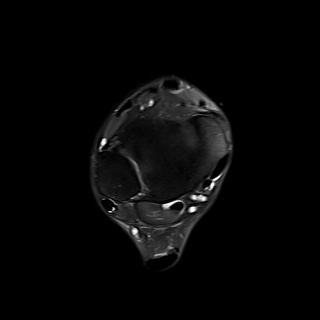
[im 30/40]
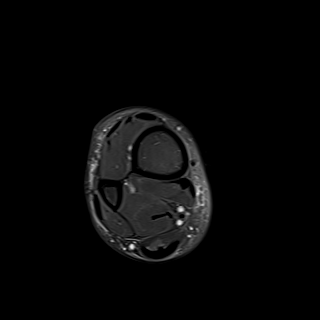
[im 40/40]
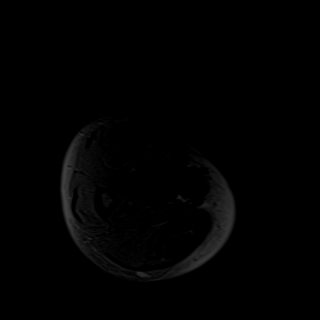

[Series 5: T1 fat-sat · axial · non-contrast · right · 5.0mm · 0.31mm/px · z∈[-69,+164]mm · 5 of 40 slices shown]
[im 1/40]
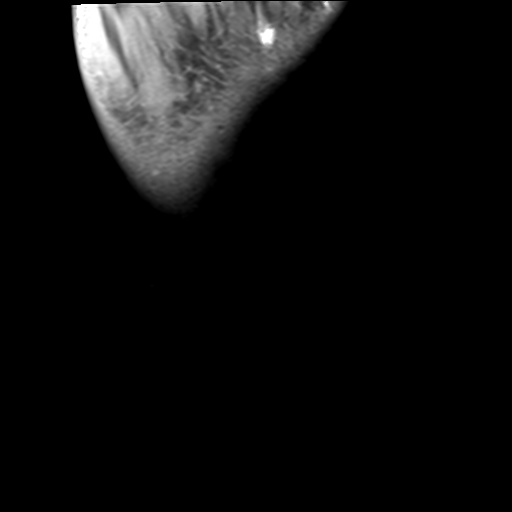
[im 10/40]
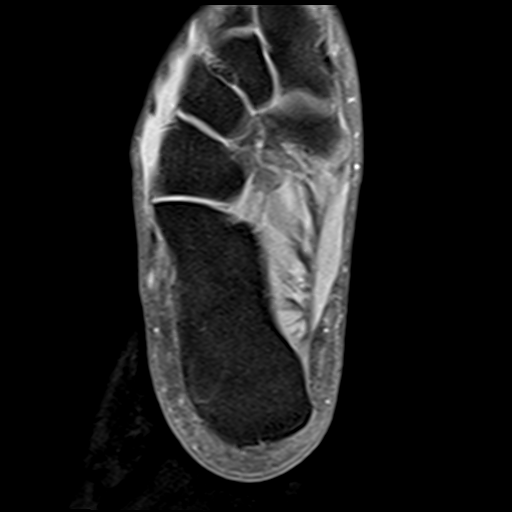
[im 20/40]
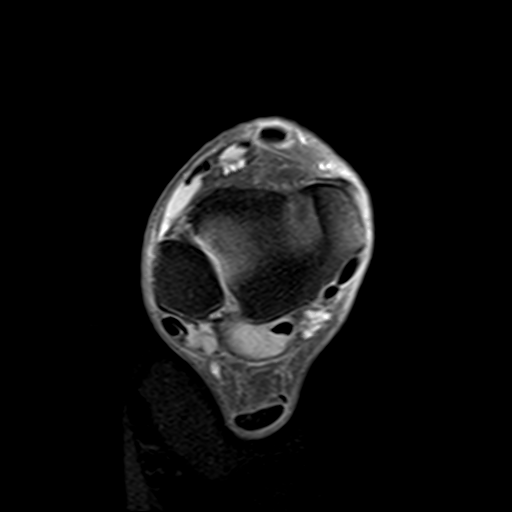
[im 30/40]
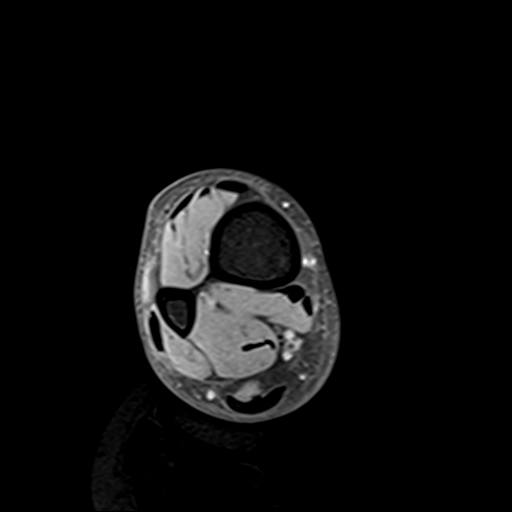
[im 40/40]
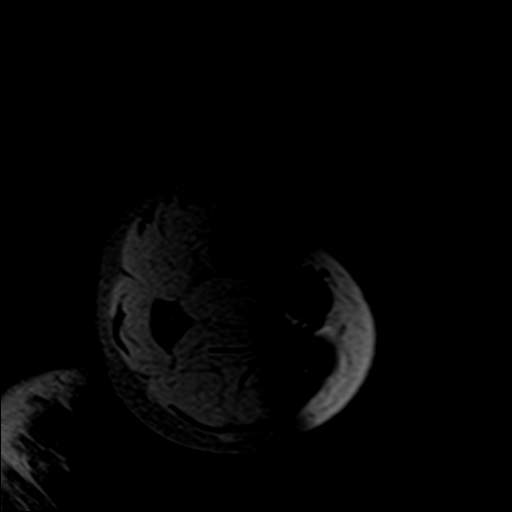

[Series 8: T1 · sagittal · right · 4.0mm · 0.70mm/px · 3 of 25 slices shown]
[im 1/25]
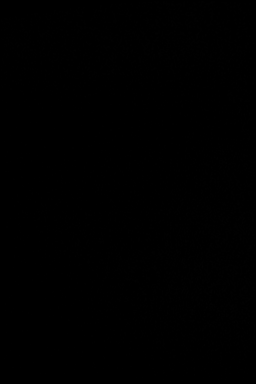
[im 13/25]
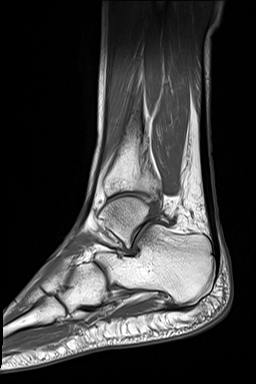
[im 25/25]
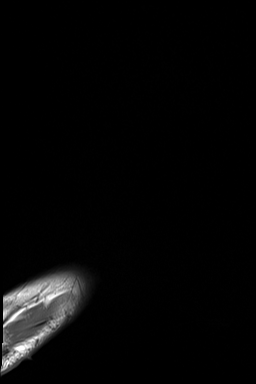

[Series 10: T1 fat-sat post-contrast · axial · right · 5.0mm · 0.31mm/px · z∈[-69,+164]mm · 5 of 40 slices shown (1 of 3)]
[im 1/40]
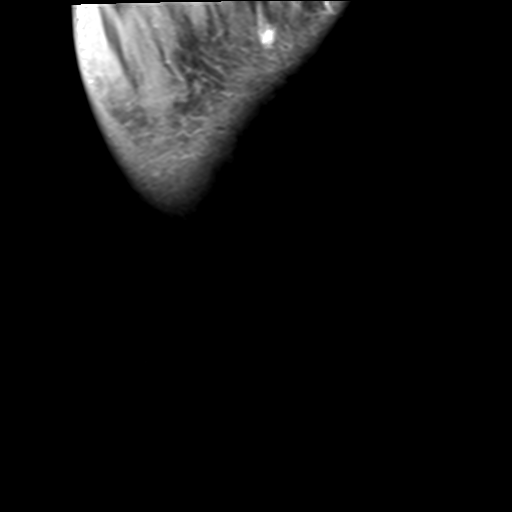
[im 10/40]
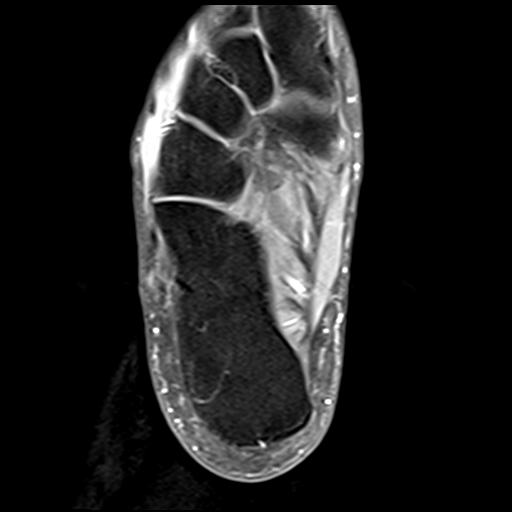
[im 20/40]
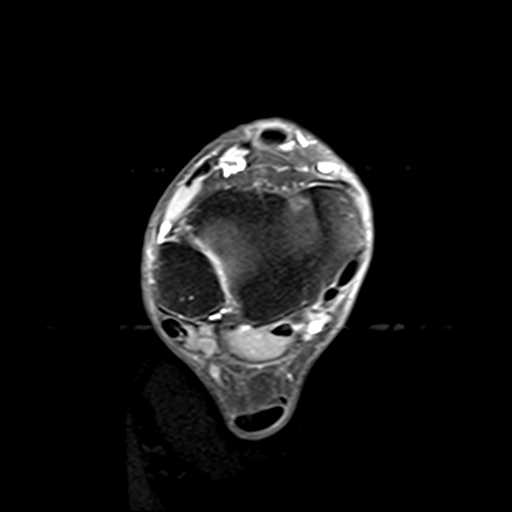
[im 30/40]
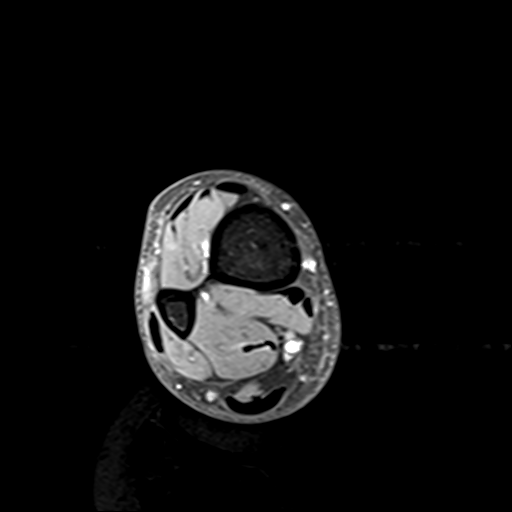
[im 40/40]
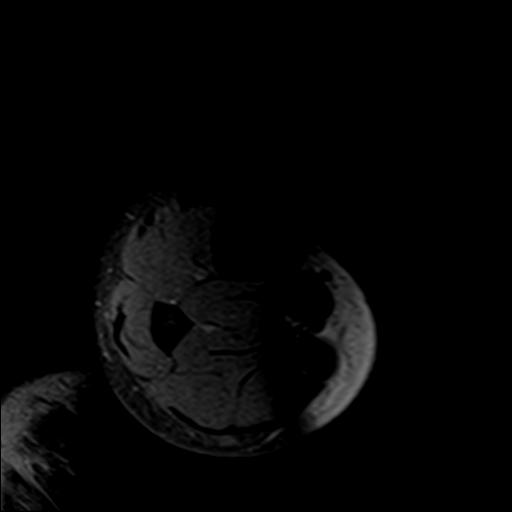

[Series 12: T1 fat-sat post-contrast · sagittal · right · 4.0mm · 0.70mm/px · 3 of 25 slices shown (2 of 3)]
[im 1/25]
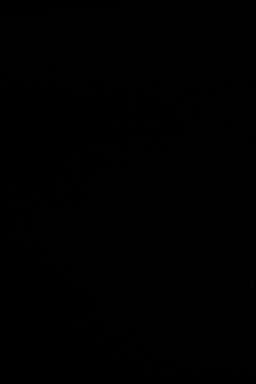
[im 13/25]
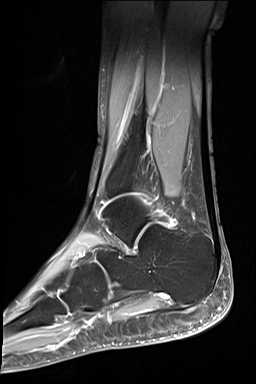
[im 25/25]
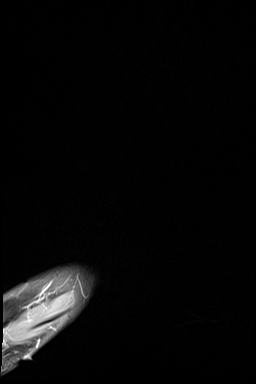

[Series 13: T1 fat-sat post-contrast · coronal · right · 3.0mm · 0.70mm/px · 5 of 40 slices shown (3 of 3)]
[im 1/40]
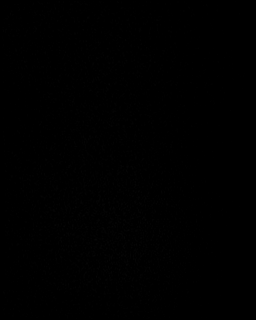
[im 10/40]
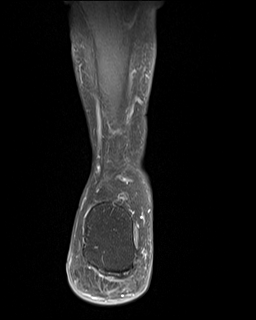
[im 20/40]
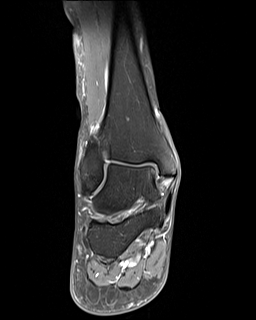
[im 30/40]
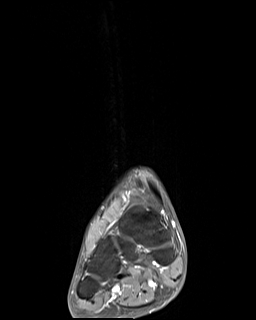
[im 40/40]
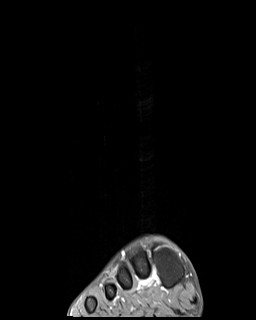

[32 of 40 positions shown; findings below may reference images not displayed]

FINDINGS: TENDONS

Peroneal: Intact.

Posteromedial: Intact. There is a large volume of fluid in the
sheath of the flexor hallucis longus posterior to the distal tibia
and in the plantar foot.

Anterior: Intact.

Achilles: Intact. There is a small volume of fluid and mild edema
anterior to the distal tendon compatible with peritendinitis.

Plantar Fascia: Intact.  No evidence of plantar fasciitis.

LIGAMENTS

Lateral: Intact. There is ill-defined edema posterior to the
posterior talofibular ligament.

Medial: Intact.

CARTILAGE

Ankle Joint: Mild degenerative change is seen with a small focus of
cartilage thinning along the medial talar dome subjacent to the
medial malleolus

Subtalar Joints/Sinus Tarsi: The sinus tarsi appears normal. There
is a large volume of fluid in the posterior subtalar recess.

Bones: No fracture or worrisome lesion. The patient has an os
trigonum measuring 1.2 cm AP x 0.5 cm craniocaudal x approximately
1.3 cm transverse.

Other: Markers are placed about the region of concern along the
anterolateral aspect of the ankle. No underlying fluid collection or
mass is identified. Ill-defined, mild subcutaneous edema is present.
IMPRESSION: IMPRESSION
Negative for mass or focal fluid collection. Only mild, ill-defined
subcutaneous edema is seen in the region of concern which could be
due to dependent change or less likely cellulitis.

Constellation of findings as described above compatible with
posterior ankle impingement (os trigonum syndrome.)

Mild Achilles peritendinitis without tear.

## 2021-07-08 ENCOUNTER — Other Ambulatory Visit: Payer: Self-pay | Admitting: Family Medicine

## 2021-07-08 NOTE — Telephone Encounter (Signed)
PCP listed is not part of [facility].

## 2022-05-19 ENCOUNTER — Telehealth: Payer: Self-pay | Admitting: Physician Assistant

## 2022-05-19 DIAGNOSIS — M109 Gout, unspecified: Secondary | ICD-10-CM

## 2022-05-20 MED ORDER — COLCHICINE 0.6 MG PO TABS: 0.6000 mg | ORAL_TABLET | Freq: Two times a day (BID) | ORAL | 0 refills | Status: DC

## 2022-05-20 NOTE — Progress Notes (Signed)
E-Visit for Gout Symptoms  We are sorry that you are not feeling well. We are here to help!  Based on what you shared with me it looks like you have a flare of your gout.  Gout is a form of arthritis. It can cause pain and swelling in the joints. At first, it tends to affect only 1 joint - most frequently the big toe. It happens in people who have too much uric acid in the blood. Uric acid is a chemical that is produced when the body breaks down certain foods. Uric acid can form sharp needle-like crystals that build up in the joints and cause pain. Uric acid crystals can also form inside the tubes that carry urine from the kidneys to the bladder. These crystals can turn into "kidney stones" that can cause pain and problems with the flow of urine. People with gout get sudden "flares" or attacks of severe pain, most often the big toe, ankle, or knee. Often the joint also turns red and swells. Usually, only 1 joint is affected, but some people have pain in more than 1 joint. Gout flares tend to happen more often during the night.  The pain from gout can be extreme. The pain and swelling are worst at the beginning of a gout flare. The symptoms then get better within a few days to weeks. It is not clear how the body "turns off" a gout flare.  Do not start any NEW preventative medicine until the gout has cleared completely. However, If you are already on Probenecid or Allopurinol for CHRONIC gout, you may continue taking this during an active flare up  I have prescribed Colchicine 0.6 mg tabs - Take 2 tabs immediately, then 1 tab twice per day for the duration of the flare up to a max of 7 days (but discontinue for stomach pains or diarrhea)    HOME CARE Losing weight can help relieve gout. It's not clear that following a specific diet plan will help with gout symptoms but eating a balanced diet can help improve your overall health. It can also help you lose weight, if you are overweight. In general, a  healthy diet includes plenty of fruits, vegetables, whole grains, and low-fat dairy products (labelled "low fat", skim, 2%). Avoid sugar sweetened drinks (including sodas, tea, juice and juice blends, coffee drinks and sports drinks) Limit alcohol to 1-2 drinks of beer, spirits or wine daily these can make gout flares worse. Some people with gout also have other health problems, such as heart disease, high blood pressure, kidney disease, or obesity. If you have any of these issues, it's important to work with your doctor to manage them. This can help improve your overall health and might also help with your gout.  GET HELP RIGHT AWAY IF: Your symptoms persist after you have completed your treatment plan You develop severe diarrhea You develop abnormal sensations  You develop vomiting,   You develop weakness  You develop abdominal pain  FOLLOW UP WITH YOUR PRIMARY PROVIDER IF: If your symptoms do not improve within 10 days  MAKE SURE YOU  Understand these instructions. Will watch your condition. Will get help right away if you are not doing well or get worse.  Thank you for choosing an e-visit.  Your e-visit answers were reviewed by a board certified advanced clinical practitioner to complete your personal care plan. Depending upon the condition, your plan could have included both over the counter or prescription medications.  Please review your   pharmacy choice. Make sure the pharmacy is open so you can pick up prescription now. If there is a problem, you may contact your provider through MyChart messaging and have the prescription routed to another pharmacy.  Your safety is important to us. If you have drug allergies check your prescription carefully.   For the next 24 hours you can use MyChart to ask questions about today's visit, request a non-urgent call back, or ask for a work or school excuse. You will get an email in the next two days asking about your experience. I hope that your  e-visit has been valuable and will speed your recovery.  

## 2022-05-20 NOTE — Progress Notes (Signed)
I have spent 5 minutes in review of e-visit questionnaire, review and updating patient chart, medical decision making and response to patient.   Porcia Morganti Cody Demiya Magno, PA-C    

## 2022-06-30 ENCOUNTER — Ambulatory Visit (INDEPENDENT_AMBULATORY_CARE_PROVIDER_SITE_OTHER): Payer: BC Managed Care – PPO

## 2022-06-30 ENCOUNTER — Ambulatory Visit
Admission: EM | Admit: 2022-06-30 | Discharge: 2022-06-30 | Disposition: A | Discharge status: Home / Self Care | Payer: BC Managed Care – PPO | Attending: Family | Admitting: Family

## 2022-06-30 DIAGNOSIS — R030 Elevated blood-pressure reading, without diagnosis of hypertension: Secondary | ICD-10-CM

## 2022-06-30 DIAGNOSIS — R051 Acute cough: Secondary | ICD-10-CM | POA: Diagnosis not present

## 2022-06-30 DIAGNOSIS — R059 Cough, unspecified: Secondary | ICD-10-CM | POA: Diagnosis not present

## 2022-06-30 DIAGNOSIS — R079 Chest pain, unspecified: Secondary | ICD-10-CM | POA: Diagnosis present

## 2022-06-30 DIAGNOSIS — M546 Pain in thoracic spine: Secondary | ICD-10-CM

## 2022-06-30 DIAGNOSIS — R0602 Shortness of breath: Secondary | ICD-10-CM | POA: Insufficient documentation

## 2022-06-30 DIAGNOSIS — D729 Disorder of white blood cells, unspecified: Secondary | ICD-10-CM | POA: Diagnosis not present

## 2022-06-30 DIAGNOSIS — R7989 Other specified abnormal findings of blood chemistry: Secondary | ICD-10-CM | POA: Diagnosis not present

## 2022-06-30 DIAGNOSIS — R3911 Hesitancy of micturition: Secondary | ICD-10-CM | POA: Diagnosis not present

## 2022-06-30 DIAGNOSIS — E785 Hyperlipidemia, unspecified: Secondary | ICD-10-CM | POA: Insufficient documentation

## 2022-06-30 DIAGNOSIS — Z79899 Other long term (current) drug therapy: Secondary | ICD-10-CM | POA: Diagnosis not present

## 2022-06-30 DIAGNOSIS — M542 Cervicalgia: Secondary | ICD-10-CM | POA: Insufficient documentation

## 2022-06-30 DIAGNOSIS — Z1152 Encounter for screening for COVID-19: Secondary | ICD-10-CM | POA: Insufficient documentation

## 2022-06-30 DIAGNOSIS — R0981 Nasal congestion: Secondary | ICD-10-CM

## 2022-06-30 DIAGNOSIS — R350 Frequency of micturition: Secondary | ICD-10-CM | POA: Insufficient documentation

## 2022-06-30 DIAGNOSIS — R9389 Abnormal findings on diagnostic imaging of other specified body structures: Secondary | ICD-10-CM | POA: Diagnosis not present

## 2022-06-30 DIAGNOSIS — R0789 Other chest pain: Secondary | ICD-10-CM | POA: Diagnosis not present

## 2022-06-30 DIAGNOSIS — R002 Palpitations: Secondary | ICD-10-CM | POA: Diagnosis not present

## 2022-06-30 DIAGNOSIS — D72828 Other elevated white blood cell count: Secondary | ICD-10-CM | POA: Diagnosis not present

## 2022-06-30 LAB — RESP PANEL BY RT-PCR (FLU A&B, COVID) ARPGX2
Influenza A by PCR: NEGATIVE
Influenza B by PCR: NEGATIVE
SARS Coronavirus 2 by RT PCR: NEGATIVE

## 2022-06-30 LAB — URINALYSIS, ROUTINE W REFLEX MICROSCOPIC
Bilirubin Urine: NEGATIVE
Glucose, UA: NEGATIVE mg/dL
Hgb urine dipstick: NEGATIVE
Ketones, ur: NEGATIVE mg/dL
Leukocytes,Ua: NEGATIVE
Nitrite: NEGATIVE
Protein, ur: NEGATIVE mg/dL
Specific Gravity, Urine: 1.01 (ref 1.005–1.030)
pH: 7 (ref 5.0–8.0)

## 2022-06-30 LAB — CBC WITH DIFFERENTIAL/PLATELET
Abs Immature Granulocytes: 0.04 10*3/uL (ref 0.00–0.07)
Basophils Absolute: 0 10*3/uL (ref 0.0–0.1)
Basophils Relative: 0 %
Eosinophils Absolute: 0.2 10*3/uL (ref 0.0–0.5)
Eosinophils Relative: 1 %
HCT: 46.9 % (ref 39.0–52.0)
Hemoglobin: 15.4 g/dL (ref 13.0–17.0)
Immature Granulocytes: 0 %
Lymphocytes Relative: 10 %
Lymphs Abs: 1.1 10*3/uL (ref 0.7–4.0)
MCH: 28.7 pg (ref 26.0–34.0)
MCHC: 32.8 g/dL (ref 30.0–36.0)
MCV: 87.5 fL (ref 80.0–100.0)
Monocytes Absolute: 0.8 10*3/uL (ref 0.1–1.0)
Monocytes Relative: 7 %
Neutro Abs: 9.5 10*3/uL — ABNORMAL HIGH (ref 1.7–7.7)
Neutrophils Relative %: 82 %
Platelets: 133 10*3/uL — ABNORMAL LOW (ref 150–400)
RBC: 5.36 MIL/uL (ref 4.22–5.81)
RDW: 13.4 % (ref 11.5–15.5)
WBC: 11.6 10*3/uL — ABNORMAL HIGH (ref 4.0–10.5)
nRBC: 0 % (ref 0.0–0.2)

## 2022-06-30 LAB — BASIC METABOLIC PANEL
Anion gap: 7 (ref 5–15)
BUN: 17 mg/dL (ref 6–20)
CO2: 28 mmol/L (ref 22–32)
Calcium: 9.6 mg/dL (ref 8.9–10.3)
Chloride: 101 mmol/L (ref 98–111)
Creatinine, Ser: 1.51 mg/dL — ABNORMAL HIGH (ref 0.61–1.24)
GFR, Estimated: 53 mL/min — ABNORMAL LOW (ref >60)
Glucose, Bld: 106 mg/dL — ABNORMAL HIGH (ref 70–99)
Potassium: 3.8 mmol/L (ref 3.5–5.1)
Sodium: 136 mmol/L (ref 135–145)

## 2022-06-30 MED ORDER — TRAMADOL HCL 50 MG PO TABS: 50.0000 mg | ORAL_TABLET | Freq: Four times a day (QID) | ORAL | 0 refills | Status: DC | PRN

## 2022-06-30 MED ORDER — DOXYCYCLINE HYCLATE 100 MG PO CAPS: 100.0000 mg | ORAL_CAPSULE | Freq: Two times a day (BID) | ORAL | 0 refills | Status: AC
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
## 2022-06-30 NOTE — Discharge Instructions (Addendum)
Recommend start Doxycycline 100mg  twice a day with food for the next 7 days. May take Tylenol 1000mg  every 8 hours as needed for pain. May take Tramadol 50mg  every 6 to 8 hours as needed for severe pain. Continue to push fluids. Rest. Call your PCP tomorrow AM to schedule appointment for follow-up in 5 days. You will need to repeat a chest x-ray in 3 to 4 weeks. If pain gets worse and having more shortness of breath, go to the ER ASAP. Otherwise, follow-up with your PCP as planned.
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
## 2022-06-30 NOTE — ED Triage Notes (Signed)
Patient c/o 1 week ago his neck started to hurt he went to the ED there and they have him pain medicine and muscle relaxer. Patient is now having pain on the right side from back to his shoulder.   Patient reports that Sunday night he started with a sinus infection.

## 2023-01-24 ENCOUNTER — Other Ambulatory Visit (INDEPENDENT_AMBULATORY_CARE_PROVIDER_SITE_OTHER): Payer: BC Managed Care – PPO

## 2023-01-24 ENCOUNTER — Ambulatory Visit: Payer: BC Managed Care – PPO | Admitting: Orthopaedic Surgery

## 2023-01-24 DIAGNOSIS — M7061 Trochanteric bursitis, right hip: Secondary | ICD-10-CM | POA: Diagnosis not present

## 2023-01-24 DIAGNOSIS — M25551 Pain in right hip: Secondary | ICD-10-CM | POA: Diagnosis not present

## 2023-01-24 MED ORDER — CELECOXIB 200 MG PO CAPS: 200.0000 mg | ORAL_CAPSULE | Freq: Two times a day (BID) | ORAL | 1 refills | Status: DC | PRN

## 2023-01-24 NOTE — Progress Notes (Signed)
  The patient is a 62 year old gentleman seeing for the first time.  He reports lateral hip pain over the right hip and he points to the trochanteric area as a source of his pain.  He does occasionally have some groin pain as well.  He denies any specific injury.  He is never had any type of stretching performed on this area and has never had any type of injection.  He is not a diabetic and is a thin individual.  Examination of his right hip shows fluid and full range of motion of the right hip with only pain to palpation over the proximal trochanter and the IT band on that side.  The remainder of the hip examined back exam is normal.  His left hip exam is normal.  An AP pelvis and lateral right hip shows normal-appearing hip joints bilaterally.  There is no cortical irregularity around the hips or the trochanteric area.  His signs and symptoms are more consistent with trochanteric bursitis.  I did recommend a steroid injection over the point of maximum tenderness which she agreed to and tolerated well.  I will send in some Celebrex for inflammation and I showed him stretching exercises that I want him to try twice daily.  All question concerns were answered addressed.  Follow-up is as needed unless things worsen.

## 2023-04-26 ENCOUNTER — Other Ambulatory Visit: Payer: Self-pay | Admitting: Orthopaedic Surgery

## 2023-11-14 ENCOUNTER — Other Ambulatory Visit: Payer: Self-pay | Admitting: Orthopaedic Surgery

## 2024-07-09 ENCOUNTER — Encounter: Payer: Self-pay | Admitting: Radiology

## 2024-08-14 ENCOUNTER — Other Ambulatory Visit: Payer: Self-pay | Admitting: Podiatry

## 2024-08-14 DIAGNOSIS — M722 Plantar fascial fibromatosis: Secondary | ICD-10-CM

## 2024-08-15 ENCOUNTER — Encounter: Payer: Self-pay | Admitting: Podiatry

## 2024-08-23 ENCOUNTER — Inpatient Hospital Stay: Admission: RE | Admit: 2024-08-23 | Discharge: 2024-08-23 | Attending: Podiatry | Admitting: Podiatry

## 2024-08-23 DIAGNOSIS — M722 Plantar fascial fibromatosis: Secondary | ICD-10-CM

## 2024-09-10 ENCOUNTER — Other Ambulatory Visit: Payer: Self-pay | Admitting: Podiatry

## 2024-09-20 ENCOUNTER — Encounter: Payer: Self-pay | Admitting: Podiatry

## 2024-09-25 NOTE — Discharge Instructions (Signed)

## 2024-09-26 ENCOUNTER — Encounter
Admission: RE | Disposition: A | Discharge status: Home / Self Care | Payer: Self-pay | Source: Home / Self Care | Attending: Podiatry

## 2024-09-26 ENCOUNTER — Encounter: Payer: Self-pay | Admitting: Podiatry

## 2024-09-26 ENCOUNTER — Ambulatory Visit: Payer: Self-pay

## 2024-09-26 ENCOUNTER — Ambulatory Visit
Admission: RE | Admit: 2024-09-26 | Discharge: 2024-09-26 | Disposition: A | Discharge status: Home / Self Care | Attending: Podiatry | Admitting: Podiatry

## 2024-09-26 ENCOUNTER — Other Ambulatory Visit: Payer: Self-pay

## 2024-09-26 DIAGNOSIS — M722 Plantar fascial fibromatosis: Secondary | ICD-10-CM | POA: Diagnosis present

## 2024-09-26 DIAGNOSIS — G5751 Tarsal tunnel syndrome, right lower limb: Secondary | ICD-10-CM | POA: Insufficient documentation

## 2024-09-26 DIAGNOSIS — M79671 Pain in right foot: Secondary | ICD-10-CM | POA: Diagnosis present

## 2024-09-26 HISTORY — PX: PLANTAR FASCIA RELEASE: SHX2239

## 2024-09-26 HISTORY — DX: Unspecified osteoarthritis, unspecified site: M19.90

## 2024-09-26 HISTORY — PX: NERVE REPAIR: SHX2083

## 2024-09-26 MED ORDER — DEXMEDETOMIDINE HCL IN NACL 80 MCG/20ML IV SOLN
INTRAVENOUS | Status: DC | PRN
  Administered 2024-09-26: 10 ug via INTRAVENOUS

## 2024-09-26 MED ORDER — EPHEDRINE 5 MG/ML INJ
INTRAVENOUS | Status: AC
  Filled 2024-09-26: qty 5

## 2024-09-26 MED ORDER — FENTANYL CITRATE (PF) 100 MCG/2ML IJ SOLN
INTRAMUSCULAR | Status: AC
  Filled 2024-09-26: qty 2

## 2024-09-26 MED ORDER — DEXAMETHASONE SODIUM PHOSPHATE 4 MG/ML IJ SOLN
INTRAMUSCULAR | Status: AC
  Filled 2024-09-26: qty 2

## 2024-09-26 MED ORDER — LACTATED RINGERS IV SOLN: INTRAVENOUS | Status: DC

## 2024-09-26 MED ORDER — PROPOFOL 10 MG/ML IV BOLUS
INTRAVENOUS | Status: AC
  Filled 2024-09-26: qty 20

## 2024-09-26 MED ORDER — ONDANSETRON HCL 4 MG/2ML IJ SOLN
INTRAMUSCULAR | Status: DC | PRN
  Administered 2024-09-26: 4 mg via INTRAVENOUS

## 2024-09-26 MED ORDER — OXYCODONE-ACETAMINOPHEN 5-325 MG PO TABS: 1.0000 | ORAL_TABLET | Freq: Four times a day (QID) | ORAL | 0 refills | Status: AC | PRN

## 2024-09-26 MED ORDER — ACETAMINOPHEN 10 MG/ML IV SOLN
INTRAVENOUS | Status: DC | PRN
  Administered 2024-09-26: 1000 mg via INTRAVENOUS

## 2024-09-26 MED ORDER — BUPIVACAINE LIPOSOME 1.3 % IJ SUSP
INTRAMUSCULAR | Status: AC
  Filled 2024-09-26: qty 10

## 2024-09-26 MED ORDER — ACETAMINOPHEN 10 MG/ML IV SOLN
INTRAVENOUS | Status: AC
  Filled 2024-09-26: qty 100

## 2024-09-26 MED ORDER — FENTANYL CITRATE (PF) 50 MCG/ML IJ SOSY: 25.0000 ug | PREFILLED_SYRINGE | INTRAMUSCULAR | Status: DC | PRN

## 2024-09-26 MED ORDER — MIDAZOLAM HCL 2 MG/2ML IJ SOLN
INTRAMUSCULAR | Status: AC
  Filled 2024-09-26: qty 2

## 2024-09-26 MED ORDER — CEFAZOLIN SODIUM-DEXTROSE 2-4 GM/100ML-% IV SOLN
2.0000 g | INTRAVENOUS | Status: AC
  Administered 2024-09-26: 2 g via INTRAVENOUS

## 2024-09-26 MED ORDER — BUPIVACAINE-EPINEPHRINE (PF) 0.25% -1:200000 IJ SOLN
INTRAMUSCULAR | Status: DC | PRN
  Administered 2024-09-26: 10 mL

## 2024-09-26 MED ORDER — DEXAMETHASONE SODIUM PHOSPHATE 4 MG/ML IJ SOLN
INTRAMUSCULAR | Status: DC | PRN
  Administered 2024-09-26: 8 mg via INTRAVENOUS

## 2024-09-26 MED ORDER — EPHEDRINE SULFATE (PRESSORS) 25 MG/5ML IV SOSY
PREFILLED_SYRINGE | INTRAVENOUS | Status: DC | PRN
  Administered 2024-09-26 (×2): 10 mg via INTRAVENOUS
  Administered 2024-09-26: 5 mg via INTRAVENOUS

## 2024-09-26 MED ORDER — BUPIVACAINE LIPOSOME 1.3 % IJ SUSP
INTRAMUSCULAR | Status: DC | PRN
  Administered 2024-09-26: 10 mL

## 2024-09-26 MED ORDER — ONDANSETRON HCL 4 MG/2ML IJ SOLN
INTRAMUSCULAR | Status: AC
  Filled 2024-09-26: qty 2

## 2024-09-26 MED ORDER — MIDAZOLAM HCL 5 MG/5ML IJ SOLN
INTRAMUSCULAR | Status: DC | PRN
  Administered 2024-09-26: 2 mg via INTRAVENOUS

## 2024-09-26 MED ORDER — FENTANYL CITRATE (PF) 100 MCG/2ML IJ SOLN
INTRAMUSCULAR | Status: DC | PRN
  Administered 2024-09-26: 25 ug via INTRAVENOUS

## 2024-09-26 MED ORDER — LIDOCAINE HCL (CARDIAC) PF 100 MG/5ML IV SOSY
PREFILLED_SYRINGE | INTRAVENOUS | Status: DC | PRN
  Administered 2024-09-26: 100 mg via INTRATRACHEAL

## 2024-09-26 MED ORDER — LIDOCAINE HCL (PF) 2 % IJ SOLN
INTRAMUSCULAR | Status: AC
  Filled 2024-09-26: qty 5

## 2024-09-26 MED ORDER — CEFAZOLIN SODIUM-DEXTROSE 2-3 GM-%(50ML) IV SOLR
INTRAVENOUS | Status: AC
  Filled 2024-09-26: qty 50

## 2024-09-26 MED ORDER — DEXMEDETOMIDINE HCL IN NACL 80 MCG/20ML IV SOLN
INTRAVENOUS | Status: AC
  Filled 2024-09-26: qty 20

## 2024-09-26 MED ORDER — PROPOFOL 10 MG/ML IV BOLUS
INTRAVENOUS | Status: DC | PRN
  Administered 2024-09-26: 200 mg via INTRAVENOUS

## 2024-09-26 NOTE — Anesthesia Procedure Notes (Addendum)
 Procedure Name: LMA Insertion Date/Time: 09/26/2024 1:25 PM  Performed by: Dali Kraner, CRNAPre-anesthesia Checklist: Patient identified, Patient being monitored, Timeout performed, Emergency Drugs available and Suction available Patient Re-evaluated:Patient Re-evaluated prior to induction Oxygen Delivery Method: Circle system utilized Preoxygenation: Pre-oxygenation with 100% oxygen Induction Type: IV induction Ventilation: Mask ventilation without difficulty LMA: LMA inserted LMA Size: 5.0 Tube type: Oral Tube size: 5.0 mm Number of attempts: 1 Placement Confirmation: positive ETCO2 and breath sounds checked- equal and bilateral Tube secured with: Tape Dental Injury: Teeth and Oropharynx as per pre-operative assessment

## 2024-09-26 NOTE — H&P (Signed)
 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  The various methods of treatment have been discussed with the patient.  No guarantees were given.  After consideration of risks, benefits and other options for treatment, the patient has consented to surgery.  I have reviewed the patients chart and labs.     A history and physical examination was performed in my office.  The patient was reexamined.  There have been no changes to this history and physical examination.  Ashley Soulier A

## 2024-09-26 NOTE — Transfer of Care (Signed)
 Immediate Anesthesia Transfer of Care Note  Patient: Kristopher Baird  Procedure(s) Performed: FASCIOTOMY, PLANTAR, ENDOSCOPIC (Right) REPAIR, NERVE (Right)  Patient Location: PACU  Anesthesia Type: General  Level of Consciousness: awake, alert  and patient cooperative  Airway and Oxygen Therapy: Patient Spontanous Breathing and Patient connected to supplemental oxygen  Post-op Assessment: Post-op Vital signs reviewed, Patient's Cardiovascular Status Stable, Respiratory Function Stable, Patent Airway and No signs of Nausea or vomiting  Post-op Vital Signs: Reviewed and stable  Complications: No notable events documented.

## 2024-09-26 NOTE — Op Note (Signed)
 Operative note   Surgeon:Cary Wilford [employer]/support/administrative Officer: None    Preop diagnosis: 1.  Plantar fasciitis right heel 2.  Distal tarsal tunnel syndrome right heel    Postop diagnosis: Same    Procedure: 1.  Endoscopic plantar fasciotomy right heel 2.  Distal tarsal tunnel release right medial heel    EBL: Minimal    Anesthesia: General With local.  Local consists of a total of 20 cc of a one-to-one mixture of 0.25% plain bupivacaine  and Exparel  long-acting anesthetic and a local block fashion    Hemostasis: Mid calf tourniquet inflated to 200 mmHg for approximately 30 minutes    Specimen: None    Complications: None    Operative indications:Kristopher Baird is an 64 y.o. that presents today for surgical intervention.  The risks/benefits/alternatives/complications have been discussed and consent has been given.    Procedure:  Patient was brought into the OR and placed on the operating table in thesupine position. After anesthesia was obtained theright lower extremity was prepped and draped in usual sterile fashion.  Attention was initially directed to the plantar medial right heel where a small stab incision was performed.  Blunt dissection carried down to the fascia.  Fascial elevator was placed over the inferior fortune of the plantar fascia.  The blunt trocar and cannula was introduced and a small stab incision was made laterally.  The cannula was left in place.  Next with the endoscope I was able to visualize the entire fascia from medial lateral.  With a small diamond blade I was able to incise the medial one half of the plantar fascial ligament down to the deep muscle belly.  The wound was flushed with copious amounts irrigation.  The blunt trocar was reintroduced and the trocar and cannula removed.  Attention was then directed to the medial aspect of the heel where the medial incision was a bulky sterile dressing was applied and all the incisions were finally closed with nylon.  Patient  was placed in a neutral 90 degree position in an equalizer walker boot for nonweightbearing.    Patient tolerated the procedure and anesthesia well.  Was transported from the OR to the PACU with all vital signs stable and vascular status intact. To be discharged per routine protocol.  Will follow up in approximately 1 week in the outpatient clinic.

## 2024-09-26 NOTE — Anesthesia Preprocedure Evaluation (Signed)
"                                    Anesthesia Evaluation  Patient identified by MRN, date of birth, ID band Patient awake    Reviewed: Allergy & Precautions, H&P , NPO status , Patient's Chart, lab work & pertinent test results  Airway Mallampati: II  TM Distance: >3 FB Neck ROM: Full    Dental no notable dental hx. (+) Teeth Intact   Pulmonary neg pulmonary ROS   Pulmonary exam normal breath sounds clear to auscultation       Cardiovascular negative cardio ROS Normal cardiovascular exam Rhythm:Regular Rate:Normal     Neuro/Psych negative neurological ROS  negative psych ROS   GI/Hepatic negative GI ROS, Neg liver ROS,,,  Endo/Other  negative endocrine ROS    Renal/GU negative Renal ROS  negative genitourinary   Musculoskeletal negative musculoskeletal ROS (+)    Abdominal   Peds negative pediatric ROS (+)  Hematology negative hematology ROS (+)   Anesthesia Other Findings   Reproductive/Obstetrics negative OB ROS                              Anesthesia Physical Anesthesia Plan  ASA: 2  Anesthesia Plan: General   Post-op Pain Management:    Induction: Intravenous  PONV Risk Score and Plan: 0  Airway Management Planned:   Additional Equipment:   Intra-op Plan:   Post-operative Plan: Extubation in OR  Informed Consent: I have reviewed the patients History and Physical, chart, labs and discussed the procedure including the risks, benefits and alternatives for the proposed anesthesia with the patient or authorized representative who has indicated his/her understanding and acceptance.     Dental advisory given  Plan Discussed with: CRNA  Anesthesia Plan Comments:         Anesthesia Quick Evaluation  "

## 2024-09-27 NOTE — Anesthesia Postprocedure Evaluation (Signed)
"   Anesthesia Post Note  Patient: Kristopher Baird  Procedure(s) Performed: FASCIOTOMY, PLANTAR, ENDOSCOPIC (Right) REPAIR, NERVE (Right)  Patient location during evaluation: PACU Anesthesia Type: General Level of consciousness: awake and alert Pain management: pain level controlled Vital Signs Assessment: post-procedure vital signs reviewed and stable Respiratory status: spontaneous breathing, nonlabored ventilation, respiratory function stable and patient connected to nasal cannula oxygen Cardiovascular status: blood pressure returned to baseline and stable Postop Assessment: no apparent nausea or vomiting Anesthetic complications: no   No notable events documented.   Last Vitals:  Vitals:   09/26/24 1430 09/26/24 1445  BP: (!) 143/83 (!) 156/83  Pulse: 84 86  Resp: 15 17  Temp:  36.6 C  SpO2: 97% 96%    Last Pain:  Vitals:   09/27/24 1104  TempSrc:   PainSc: 0-No pain                 Fairy A Loy Mccartt      "
